# Patient Record
Sex: Male | Born: 1970
Health system: Southern US, Community
[De-identification: ages and names within clinical notes are randomized; demographics above are authoritative.]

## PROBLEM LIST (undated history)

## (undated) DIAGNOSIS — I10 Essential (primary) hypertension: Secondary | ICD-10-CM

## (undated) DIAGNOSIS — R569 Unspecified convulsions: Secondary | ICD-10-CM

## (undated) DIAGNOSIS — I519 Heart disease, unspecified: Secondary | ICD-10-CM

## (undated) DIAGNOSIS — I639 Cerebral infarction, unspecified: Secondary | ICD-10-CM

## (undated) DIAGNOSIS — D689 Coagulation defect, unspecified: Secondary | ICD-10-CM

## (undated) DIAGNOSIS — I219 Acute myocardial infarction, unspecified: Secondary | ICD-10-CM

## (undated) DIAGNOSIS — E785 Hyperlipidemia, unspecified: Secondary | ICD-10-CM

## (undated) DIAGNOSIS — I2510 Atherosclerotic heart disease of native coronary artery without angina pectoris: Secondary | ICD-10-CM

## (undated) DIAGNOSIS — G40909 Epilepsy, unspecified, not intractable, without status epilepticus: Secondary | ICD-10-CM

## (undated) DIAGNOSIS — K5190 Ulcerative colitis, unspecified, without complications: Secondary | ICD-10-CM

## (undated) DIAGNOSIS — I499 Cardiac arrhythmia, unspecified: Secondary | ICD-10-CM

## (undated) DIAGNOSIS — K589 Irritable bowel syndrome without diarrhea: Secondary | ICD-10-CM

## (undated) DIAGNOSIS — I469 Cardiac arrest, cause unspecified: Secondary | ICD-10-CM

## (undated) HISTORY — DX: Heart disease, unspecified: I51.9

## (undated) HISTORY — DX: Unspecified convulsions: R56.9

## (undated) HISTORY — DX: Coagulation defect, unspecified: D68.9

## (undated) HISTORY — DX: Hyperlipidemia, unspecified: E78.5

## (undated) HISTORY — PX: CARDIAC DEFIBRILLATOR PLACEMENT: SHX171

## (undated) HISTORY — DX: Atherosclerotic heart disease of native coronary artery without angina pectoris: I25.10

## (undated) HISTORY — DX: Cardiac arrhythmia, unspecified: I49.9

## (undated) HISTORY — DX: Cerebral infarction, unspecified: I63.9

## (undated) HISTORY — PX: CARDIAC CATHETERIZATION: SHX172

---

## 2015-04-20 DIAGNOSIS — Z5189 Encounter for other specified aftercare: Secondary | ICD-10-CM | POA: Diagnosis not present

## 2015-04-20 DIAGNOSIS — R269 Unspecified abnormalities of gait and mobility: Secondary | ICD-10-CM | POA: Diagnosis not present

## 2015-06-12 DIAGNOSIS — G4700 Insomnia, unspecified: Secondary | ICD-10-CM | POA: Diagnosis not present

## 2015-06-12 DIAGNOSIS — I639 Cerebral infarction, unspecified: Secondary | ICD-10-CM | POA: Diagnosis not present

## 2015-06-12 DIAGNOSIS — I69398 Other sequelae of cerebral infarction: Secondary | ICD-10-CM | POA: Diagnosis not present

## 2015-06-12 DIAGNOSIS — R569 Unspecified convulsions: Secondary | ICD-10-CM | POA: Diagnosis not present

## 2015-07-12 DIAGNOSIS — Z955 Presence of coronary angioplasty implant and graft: Secondary | ICD-10-CM | POA: Diagnosis not present

## 2015-07-12 DIAGNOSIS — I2510 Atherosclerotic heart disease of native coronary artery without angina pectoris: Secondary | ICD-10-CM | POA: Diagnosis not present

## 2015-07-12 DIAGNOSIS — E785 Hyperlipidemia, unspecified: Secondary | ICD-10-CM | POA: Diagnosis not present

## 2015-07-12 DIAGNOSIS — I10 Essential (primary) hypertension: Secondary | ICD-10-CM | POA: Diagnosis not present

## 2015-07-25 DIAGNOSIS — Z955 Presence of coronary angioplasty implant and graft: Secondary | ICD-10-CM | POA: Diagnosis not present

## 2015-07-25 DIAGNOSIS — I2510 Atherosclerotic heart disease of native coronary artery without angina pectoris: Secondary | ICD-10-CM | POA: Diagnosis not present

## 2015-07-25 DIAGNOSIS — I639 Cerebral infarction, unspecified: Secondary | ICD-10-CM | POA: Diagnosis not present

## 2015-09-21 DIAGNOSIS — I252 Old myocardial infarction: Secondary | ICD-10-CM | POA: Diagnosis not present

## 2015-09-21 DIAGNOSIS — I2510 Atherosclerotic heart disease of native coronary artery without angina pectoris: Secondary | ICD-10-CM | POA: Diagnosis not present

## 2015-09-21 DIAGNOSIS — I10 Essential (primary) hypertension: Secondary | ICD-10-CM | POA: Diagnosis not present

## 2015-09-21 DIAGNOSIS — R0602 Shortness of breath: Secondary | ICD-10-CM | POA: Diagnosis not present

## 2015-09-21 DIAGNOSIS — R569 Unspecified convulsions: Secondary | ICD-10-CM | POA: Diagnosis not present

## 2015-09-21 DIAGNOSIS — E876 Hypokalemia: Secondary | ICD-10-CM | POA: Diagnosis not present

## 2015-09-21 DIAGNOSIS — R9082 White matter disease, unspecified: Secondary | ICD-10-CM | POA: Diagnosis not present

## 2015-09-21 DIAGNOSIS — Z7902 Long term (current) use of antithrombotics/antiplatelets: Secondary | ICD-10-CM | POA: Diagnosis not present

## 2015-09-21 DIAGNOSIS — G9389 Other specified disorders of brain: Secondary | ICD-10-CM | POA: Diagnosis not present

## 2015-09-21 DIAGNOSIS — Z8673 Personal history of transient ischemic attack (TIA), and cerebral infarction without residual deficits: Secondary | ICD-10-CM | POA: Diagnosis not present

## 2015-09-21 DIAGNOSIS — I255 Ischemic cardiomyopathy: Secondary | ICD-10-CM | POA: Diagnosis not present

## 2015-09-21 DIAGNOSIS — I672 Cerebral atherosclerosis: Secondary | ICD-10-CM | POA: Diagnosis not present

## 2015-09-21 DIAGNOSIS — N390 Urinary tract infection, site not specified: Secondary | ICD-10-CM | POA: Diagnosis not present

## 2015-09-21 DIAGNOSIS — Z888 Allergy status to other drugs, medicaments and biological substances status: Secondary | ICD-10-CM | POA: Diagnosis not present

## 2015-09-21 DIAGNOSIS — J329 Chronic sinusitis, unspecified: Secondary | ICD-10-CM | POA: Diagnosis not present

## 2015-11-20 DIAGNOSIS — Z87891 Personal history of nicotine dependence: Secondary | ICD-10-CM | POA: Diagnosis not present

## 2015-11-20 DIAGNOSIS — I10 Essential (primary) hypertension: Secondary | ICD-10-CM | POA: Diagnosis not present

## 2015-11-20 DIAGNOSIS — D72829 Elevated white blood cell count, unspecified: Secondary | ICD-10-CM | POA: Diagnosis not present

## 2015-11-20 DIAGNOSIS — R569 Unspecified convulsions: Secondary | ICD-10-CM | POA: Diagnosis not present

## 2015-11-20 DIAGNOSIS — G40909 Epilepsy, unspecified, not intractable, without status epilepticus: Secondary | ICD-10-CM | POA: Diagnosis not present

## 2015-11-20 DIAGNOSIS — I160 Hypertensive urgency: Secondary | ICD-10-CM | POA: Diagnosis not present

## 2015-11-20 DIAGNOSIS — I2510 Atherosclerotic heart disease of native coronary artery without angina pectoris: Secondary | ICD-10-CM | POA: Diagnosis not present

## 2015-11-20 DIAGNOSIS — Z8673 Personal history of transient ischemic attack (TIA), and cerebral infarction without residual deficits: Secondary | ICD-10-CM | POA: Diagnosis not present

## 2015-11-20 DIAGNOSIS — I255 Ischemic cardiomyopathy: Secondary | ICD-10-CM | POA: Diagnosis not present

## 2015-11-20 DIAGNOSIS — Z955 Presence of coronary angioplasty implant and graft: Secondary | ICD-10-CM | POA: Diagnosis not present

## 2015-11-20 DIAGNOSIS — D6851 Activated protein C resistance: Secondary | ICD-10-CM | POA: Diagnosis not present

## 2015-11-20 DIAGNOSIS — I252 Old myocardial infarction: Secondary | ICD-10-CM | POA: Diagnosis not present

## 2015-11-20 DIAGNOSIS — G4700 Insomnia, unspecified: Secondary | ICD-10-CM | POA: Diagnosis not present

## 2015-11-20 DIAGNOSIS — R079 Chest pain, unspecified: Secondary | ICD-10-CM | POA: Diagnosis not present

## 2015-11-20 DIAGNOSIS — N179 Acute kidney failure, unspecified: Secondary | ICD-10-CM | POA: Diagnosis not present

## 2015-11-20 DIAGNOSIS — J9811 Atelectasis: Secondary | ICD-10-CM | POA: Diagnosis not present

## 2016-01-18 DIAGNOSIS — I639 Cerebral infarction, unspecified: Secondary | ICD-10-CM | POA: Diagnosis not present

## 2016-01-18 DIAGNOSIS — D6851 Activated protein C resistance: Secondary | ICD-10-CM | POA: Diagnosis not present

## 2016-02-16 DIAGNOSIS — Z743 Need for continuous supervision: Secondary | ICD-10-CM | POA: Diagnosis not present

## 2016-02-16 DIAGNOSIS — R4182 Altered mental status, unspecified: Secondary | ICD-10-CM | POA: Diagnosis not present

## 2016-02-17 DIAGNOSIS — R51 Headache: Secondary | ICD-10-CM | POA: Diagnosis not present

## 2016-02-17 DIAGNOSIS — S0990XA Unspecified injury of head, initial encounter: Secondary | ICD-10-CM | POA: Diagnosis not present

## 2016-04-04 DIAGNOSIS — R9082 White matter disease, unspecified: Secondary | ICD-10-CM | POA: Diagnosis not present

## 2016-04-04 DIAGNOSIS — Z950 Presence of cardiac pacemaker: Secondary | ICD-10-CM | POA: Diagnosis not present

## 2016-04-04 DIAGNOSIS — R4182 Altered mental status, unspecified: Secondary | ICD-10-CM | POA: Diagnosis not present

## 2016-04-04 DIAGNOSIS — D72829 Elevated white blood cell count, unspecified: Secondary | ICD-10-CM | POA: Diagnosis not present

## 2016-04-04 DIAGNOSIS — J9811 Atelectasis: Secondary | ICD-10-CM | POA: Diagnosis not present

## 2016-04-04 DIAGNOSIS — D6851 Activated protein C resistance: Secondary | ICD-10-CM | POA: Diagnosis not present

## 2016-04-04 DIAGNOSIS — Z743 Need for continuous supervision: Secondary | ICD-10-CM | POA: Diagnosis not present

## 2016-04-04 DIAGNOSIS — E785 Hyperlipidemia, unspecified: Secondary | ICD-10-CM | POA: Diagnosis not present

## 2016-04-04 DIAGNOSIS — J181 Lobar pneumonia, unspecified organism: Secondary | ICD-10-CM | POA: Diagnosis not present

## 2016-04-04 DIAGNOSIS — E876 Hypokalemia: Secondary | ICD-10-CM | POA: Diagnosis not present

## 2016-04-04 DIAGNOSIS — I469 Cardiac arrest, cause unspecified: Secondary | ICD-10-CM | POA: Diagnosis not present

## 2016-04-04 DIAGNOSIS — I2510 Atherosclerotic heart disease of native coronary artery without angina pectoris: Secondary | ICD-10-CM | POA: Diagnosis not present

## 2016-04-04 DIAGNOSIS — J90 Pleural effusion, not elsewhere classified: Secondary | ICD-10-CM | POA: Diagnosis not present

## 2016-04-04 DIAGNOSIS — K3189 Other diseases of stomach and duodenum: Secondary | ICD-10-CM | POA: Diagnosis not present

## 2016-04-04 DIAGNOSIS — R569 Unspecified convulsions: Secondary | ICD-10-CM | POA: Diagnosis not present

## 2016-04-04 DIAGNOSIS — J156 Pneumonia due to other aerobic Gram-negative bacteria: Secondary | ICD-10-CM | POA: Diagnosis not present

## 2016-04-04 DIAGNOSIS — R1110 Vomiting, unspecified: Secondary | ICD-10-CM | POA: Diagnosis not present

## 2016-04-04 DIAGNOSIS — G9389 Other specified disorders of brain: Secondary | ICD-10-CM | POA: Diagnosis not present

## 2016-04-04 DIAGNOSIS — A419 Sepsis, unspecified organism: Secondary | ICD-10-CM | POA: Diagnosis not present

## 2016-04-04 DIAGNOSIS — J9600 Acute respiratory failure, unspecified whether with hypoxia or hypercapnia: Secondary | ICD-10-CM | POA: Diagnosis not present

## 2016-04-04 DIAGNOSIS — R140 Abdominal distension (gaseous): Secondary | ICD-10-CM | POA: Diagnosis not present

## 2016-04-04 DIAGNOSIS — G9341 Metabolic encephalopathy: Secondary | ICD-10-CM | POA: Diagnosis not present

## 2016-04-04 DIAGNOSIS — Z7901 Long term (current) use of anticoagulants: Secondary | ICD-10-CM | POA: Diagnosis not present

## 2016-04-04 DIAGNOSIS — I252 Old myocardial infarction: Secondary | ICD-10-CM | POA: Diagnosis not present

## 2016-04-04 DIAGNOSIS — Z4682 Encounter for fitting and adjustment of non-vascular catheter: Secondary | ICD-10-CM | POA: Diagnosis not present

## 2016-04-04 DIAGNOSIS — Z8673 Personal history of transient ischemic attack (TIA), and cerebral infarction without residual deficits: Secondary | ICD-10-CM | POA: Diagnosis not present

## 2016-04-04 DIAGNOSIS — Z431 Encounter for attention to gastrostomy: Secondary | ICD-10-CM | POA: Diagnosis not present

## 2016-04-04 DIAGNOSIS — K5010 Crohn's disease of large intestine without complications: Secondary | ICD-10-CM | POA: Diagnosis not present

## 2016-04-04 DIAGNOSIS — I472 Ventricular tachycardia: Secondary | ICD-10-CM | POA: Diagnosis not present

## 2016-04-04 DIAGNOSIS — E872 Acidosis: Secondary | ICD-10-CM | POA: Diagnosis not present

## 2016-04-04 DIAGNOSIS — J189 Pneumonia, unspecified organism: Secondary | ICD-10-CM | POA: Diagnosis not present

## 2016-04-04 DIAGNOSIS — R6510 Systemic inflammatory response syndrome (SIRS) of non-infectious origin without acute organ dysfunction: Secondary | ICD-10-CM | POA: Diagnosis not present

## 2016-04-04 DIAGNOSIS — Z452 Encounter for adjustment and management of vascular access device: Secondary | ICD-10-CM | POA: Diagnosis not present

## 2016-04-04 DIAGNOSIS — R2241 Localized swelling, mass and lump, right lower limb: Secondary | ICD-10-CM | POA: Diagnosis not present

## 2016-04-04 DIAGNOSIS — J9601 Acute respiratory failure with hypoxia: Secondary | ICD-10-CM | POA: Diagnosis not present

## 2016-04-04 DIAGNOSIS — R918 Other nonspecific abnormal finding of lung field: Secondary | ICD-10-CM | POA: Diagnosis not present

## 2016-04-04 DIAGNOSIS — I4901 Ventricular fibrillation: Secondary | ICD-10-CM | POA: Diagnosis not present

## 2016-04-04 DIAGNOSIS — I5021 Acute systolic (congestive) heart failure: Secondary | ICD-10-CM | POA: Diagnosis not present

## 2016-04-04 DIAGNOSIS — I119 Hypertensive heart disease without heart failure: Secondary | ICD-10-CM | POA: Diagnosis not present

## 2016-04-04 DIAGNOSIS — R1312 Dysphagia, oropharyngeal phase: Secondary | ICD-10-CM | POA: Diagnosis not present

## 2016-04-04 DIAGNOSIS — J0100 Acute maxillary sinusitis, unspecified: Secondary | ICD-10-CM | POA: Diagnosis not present

## 2016-04-04 DIAGNOSIS — Z978 Presence of other specified devices: Secondary | ICD-10-CM | POA: Diagnosis not present

## 2016-04-04 DIAGNOSIS — J690 Pneumonitis due to inhalation of food and vomit: Secondary | ICD-10-CM | POA: Diagnosis not present

## 2016-04-05 DIAGNOSIS — I469 Cardiac arrest, cause unspecified: Secondary | ICD-10-CM | POA: Diagnosis not present

## 2016-04-05 DIAGNOSIS — R4182 Altered mental status, unspecified: Secondary | ICD-10-CM | POA: Diagnosis not present

## 2016-04-05 DIAGNOSIS — J9600 Acute respiratory failure, unspecified whether with hypoxia or hypercapnia: Secondary | ICD-10-CM | POA: Diagnosis not present

## 2016-04-05 DIAGNOSIS — Z8673 Personal history of transient ischemic attack (TIA), and cerebral infarction without residual deficits: Secondary | ICD-10-CM | POA: Diagnosis not present

## 2016-04-05 DIAGNOSIS — D6851 Activated protein C resistance: Secondary | ICD-10-CM | POA: Diagnosis not present

## 2016-04-05 DIAGNOSIS — R569 Unspecified convulsions: Secondary | ICD-10-CM | POA: Diagnosis not present

## 2016-04-06 DIAGNOSIS — R569 Unspecified convulsions: Secondary | ICD-10-CM | POA: Diagnosis not present

## 2016-04-06 DIAGNOSIS — I469 Cardiac arrest, cause unspecified: Secondary | ICD-10-CM | POA: Diagnosis not present

## 2016-04-06 DIAGNOSIS — D6851 Activated protein C resistance: Secondary | ICD-10-CM | POA: Diagnosis not present

## 2016-04-06 DIAGNOSIS — R4182 Altered mental status, unspecified: Secondary | ICD-10-CM | POA: Diagnosis not present

## 2016-04-06 DIAGNOSIS — Z8673 Personal history of transient ischemic attack (TIA), and cerebral infarction without residual deficits: Secondary | ICD-10-CM | POA: Diagnosis not present

## 2016-04-06 DIAGNOSIS — R918 Other nonspecific abnormal finding of lung field: Secondary | ICD-10-CM | POA: Diagnosis not present

## 2016-04-06 DIAGNOSIS — Z978 Presence of other specified devices: Secondary | ICD-10-CM | POA: Diagnosis not present

## 2016-04-06 DIAGNOSIS — J9600 Acute respiratory failure, unspecified whether with hypoxia or hypercapnia: Secondary | ICD-10-CM | POA: Diagnosis not present

## 2016-04-07 DIAGNOSIS — R918 Other nonspecific abnormal finding of lung field: Secondary | ICD-10-CM | POA: Diagnosis not present

## 2016-04-07 DIAGNOSIS — D6851 Activated protein C resistance: Secondary | ICD-10-CM | POA: Diagnosis not present

## 2016-04-07 DIAGNOSIS — I5021 Acute systolic (congestive) heart failure: Secondary | ICD-10-CM | POA: Diagnosis not present

## 2016-04-07 DIAGNOSIS — R2241 Localized swelling, mass and lump, right lower limb: Secondary | ICD-10-CM | POA: Diagnosis not present

## 2016-04-07 DIAGNOSIS — I469 Cardiac arrest, cause unspecified: Secondary | ICD-10-CM | POA: Diagnosis not present

## 2016-04-07 DIAGNOSIS — J9601 Acute respiratory failure with hypoxia: Secondary | ICD-10-CM | POA: Diagnosis not present

## 2016-04-07 DIAGNOSIS — J690 Pneumonitis due to inhalation of food and vomit: Secondary | ICD-10-CM | POA: Diagnosis not present

## 2016-04-08 DIAGNOSIS — J90 Pleural effusion, not elsewhere classified: Secondary | ICD-10-CM | POA: Diagnosis not present

## 2016-04-08 DIAGNOSIS — J9811 Atelectasis: Secondary | ICD-10-CM | POA: Diagnosis not present

## 2016-04-08 DIAGNOSIS — J189 Pneumonia, unspecified organism: Secondary | ICD-10-CM | POA: Diagnosis not present

## 2016-04-08 DIAGNOSIS — J9601 Acute respiratory failure with hypoxia: Secondary | ICD-10-CM | POA: Diagnosis not present

## 2016-04-08 DIAGNOSIS — E876 Hypokalemia: Secondary | ICD-10-CM | POA: Diagnosis not present

## 2016-04-08 DIAGNOSIS — R918 Other nonspecific abnormal finding of lung field: Secondary | ICD-10-CM | POA: Diagnosis not present

## 2016-04-08 DIAGNOSIS — A419 Sepsis, unspecified organism: Secondary | ICD-10-CM | POA: Diagnosis not present

## 2016-04-09 DIAGNOSIS — J9811 Atelectasis: Secondary | ICD-10-CM | POA: Diagnosis not present

## 2016-04-09 DIAGNOSIS — J9601 Acute respiratory failure with hypoxia: Secondary | ICD-10-CM | POA: Diagnosis not present

## 2016-04-09 DIAGNOSIS — I4901 Ventricular fibrillation: Secondary | ICD-10-CM | POA: Diagnosis not present

## 2016-04-09 DIAGNOSIS — Z431 Encounter for attention to gastrostomy: Secondary | ICD-10-CM | POA: Diagnosis not present

## 2016-04-09 DIAGNOSIS — A419 Sepsis, unspecified organism: Secondary | ICD-10-CM | POA: Diagnosis not present

## 2016-04-09 DIAGNOSIS — J189 Pneumonia, unspecified organism: Secondary | ICD-10-CM | POA: Diagnosis not present

## 2016-04-09 DIAGNOSIS — J90 Pleural effusion, not elsewhere classified: Secondary | ICD-10-CM | POA: Diagnosis not present

## 2016-04-10 DIAGNOSIS — K3189 Other diseases of stomach and duodenum: Secondary | ICD-10-CM | POA: Diagnosis not present

## 2016-04-10 DIAGNOSIS — J189 Pneumonia, unspecified organism: Secondary | ICD-10-CM | POA: Diagnosis not present

## 2016-04-10 DIAGNOSIS — R918 Other nonspecific abnormal finding of lung field: Secondary | ICD-10-CM | POA: Diagnosis not present

## 2016-04-10 DIAGNOSIS — G9341 Metabolic encephalopathy: Secondary | ICD-10-CM | POA: Diagnosis not present

## 2016-04-10 DIAGNOSIS — J90 Pleural effusion, not elsewhere classified: Secondary | ICD-10-CM | POA: Diagnosis not present

## 2016-04-11 DIAGNOSIS — J189 Pneumonia, unspecified organism: Secondary | ICD-10-CM | POA: Diagnosis not present

## 2016-04-11 DIAGNOSIS — J9601 Acute respiratory failure with hypoxia: Secondary | ICD-10-CM | POA: Diagnosis not present

## 2016-04-11 DIAGNOSIS — R918 Other nonspecific abnormal finding of lung field: Secondary | ICD-10-CM | POA: Diagnosis not present

## 2016-04-12 DIAGNOSIS — R1312 Dysphagia, oropharyngeal phase: Secondary | ICD-10-CM | POA: Diagnosis not present

## 2016-04-12 DIAGNOSIS — I472 Ventricular tachycardia: Secondary | ICD-10-CM | POA: Diagnosis not present

## 2016-04-12 DIAGNOSIS — J181 Lobar pneumonia, unspecified organism: Secondary | ICD-10-CM | POA: Diagnosis not present

## 2016-04-12 DIAGNOSIS — I469 Cardiac arrest, cause unspecified: Secondary | ICD-10-CM | POA: Diagnosis not present

## 2016-04-13 DIAGNOSIS — R1110 Vomiting, unspecified: Secondary | ICD-10-CM | POA: Diagnosis not present

## 2016-04-13 DIAGNOSIS — Z978 Presence of other specified devices: Secondary | ICD-10-CM | POA: Diagnosis not present

## 2016-04-13 DIAGNOSIS — R6510 Systemic inflammatory response syndrome (SIRS) of non-infectious origin without acute organ dysfunction: Secondary | ICD-10-CM | POA: Diagnosis not present

## 2016-04-13 DIAGNOSIS — R918 Other nonspecific abnormal finding of lung field: Secondary | ICD-10-CM | POA: Diagnosis not present

## 2016-04-13 DIAGNOSIS — R140 Abdominal distension (gaseous): Secondary | ICD-10-CM | POA: Diagnosis not present

## 2016-04-13 DIAGNOSIS — J189 Pneumonia, unspecified organism: Secondary | ICD-10-CM | POA: Diagnosis not present

## 2016-04-14 DIAGNOSIS — J90 Pleural effusion, not elsewhere classified: Secondary | ICD-10-CM | POA: Diagnosis not present

## 2016-04-14 DIAGNOSIS — D72829 Elevated white blood cell count, unspecified: Secondary | ICD-10-CM | POA: Diagnosis not present

## 2016-04-14 DIAGNOSIS — J189 Pneumonia, unspecified organism: Secondary | ICD-10-CM | POA: Diagnosis not present

## 2016-04-14 DIAGNOSIS — J9601 Acute respiratory failure with hypoxia: Secondary | ICD-10-CM | POA: Diagnosis not present

## 2016-04-14 DIAGNOSIS — R918 Other nonspecific abnormal finding of lung field: Secondary | ICD-10-CM | POA: Diagnosis not present

## 2016-04-15 DIAGNOSIS — R918 Other nonspecific abnormal finding of lung field: Secondary | ICD-10-CM | POA: Diagnosis not present

## 2016-04-15 DIAGNOSIS — D6851 Activated protein C resistance: Secondary | ICD-10-CM | POA: Diagnosis not present

## 2016-04-15 DIAGNOSIS — I469 Cardiac arrest, cause unspecified: Secondary | ICD-10-CM | POA: Diagnosis not present

## 2016-04-15 DIAGNOSIS — J9601 Acute respiratory failure with hypoxia: Secondary | ICD-10-CM | POA: Diagnosis not present

## 2016-04-15 DIAGNOSIS — J690 Pneumonitis due to inhalation of food and vomit: Secondary | ICD-10-CM | POA: Diagnosis not present

## 2016-04-16 DIAGNOSIS — I4901 Ventricular fibrillation: Secondary | ICD-10-CM | POA: Diagnosis not present

## 2016-04-16 DIAGNOSIS — I469 Cardiac arrest, cause unspecified: Secondary | ICD-10-CM | POA: Diagnosis not present

## 2016-04-16 DIAGNOSIS — R918 Other nonspecific abnormal finding of lung field: Secondary | ICD-10-CM | POA: Diagnosis not present

## 2016-04-16 DIAGNOSIS — Z950 Presence of cardiac pacemaker: Secondary | ICD-10-CM | POA: Diagnosis not present

## 2016-04-16 DIAGNOSIS — J9601 Acute respiratory failure with hypoxia: Secondary | ICD-10-CM | POA: Diagnosis not present

## 2016-04-16 DIAGNOSIS — I472 Ventricular tachycardia: Secondary | ICD-10-CM | POA: Diagnosis not present

## 2016-04-16 DIAGNOSIS — J690 Pneumonitis due to inhalation of food and vomit: Secondary | ICD-10-CM | POA: Diagnosis not present

## 2016-04-16 DIAGNOSIS — J90 Pleural effusion, not elsewhere classified: Secondary | ICD-10-CM | POA: Diagnosis not present

## 2016-04-16 DIAGNOSIS — D6851 Activated protein C resistance: Secondary | ICD-10-CM | POA: Diagnosis not present

## 2016-04-16 HISTORY — PX: ICD IMPLANT: EP1208

## 2016-04-17 DIAGNOSIS — G9341 Metabolic encephalopathy: Secondary | ICD-10-CM | POA: Diagnosis not present

## 2016-04-17 DIAGNOSIS — A419 Sepsis, unspecified organism: Secondary | ICD-10-CM | POA: Diagnosis not present

## 2016-04-17 DIAGNOSIS — J9601 Acute respiratory failure with hypoxia: Secondary | ICD-10-CM | POA: Diagnosis not present

## 2016-04-17 DIAGNOSIS — I469 Cardiac arrest, cause unspecified: Secondary | ICD-10-CM | POA: Diagnosis not present

## 2016-04-25 DIAGNOSIS — Z09 Encounter for follow-up examination after completed treatment for conditions other than malignant neoplasm: Secondary | ICD-10-CM | POA: Diagnosis not present

## 2016-04-25 DIAGNOSIS — I252 Old myocardial infarction: Secondary | ICD-10-CM | POA: Diagnosis not present

## 2016-05-01 DIAGNOSIS — I429 Cardiomyopathy, unspecified: Secondary | ICD-10-CM | POA: Diagnosis not present

## 2016-05-01 DIAGNOSIS — Z95810 Presence of automatic (implantable) cardiac defibrillator: Secondary | ICD-10-CM | POA: Diagnosis not present

## 2016-05-01 DIAGNOSIS — R4182 Altered mental status, unspecified: Secondary | ICD-10-CM | POA: Diagnosis not present

## 2016-05-22 DIAGNOSIS — Z95810 Presence of automatic (implantable) cardiac defibrillator: Secondary | ICD-10-CM | POA: Diagnosis not present

## 2016-05-22 DIAGNOSIS — I429 Cardiomyopathy, unspecified: Secondary | ICD-10-CM | POA: Diagnosis not present

## 2017-01-20 DIAGNOSIS — Z95810 Presence of automatic (implantable) cardiac defibrillator: Secondary | ICD-10-CM | POA: Diagnosis not present

## 2017-01-20 DIAGNOSIS — I429 Cardiomyopathy, unspecified: Secondary | ICD-10-CM | POA: Diagnosis not present

## 2017-01-29 DIAGNOSIS — Z95810 Presence of automatic (implantable) cardiac defibrillator: Secondary | ICD-10-CM | POA: Diagnosis not present

## 2017-01-29 DIAGNOSIS — I429 Cardiomyopathy, unspecified: Secondary | ICD-10-CM | POA: Diagnosis not present

## 2017-02-06 DIAGNOSIS — R4781 Slurred speech: Secondary | ICD-10-CM | POA: Diagnosis not present

## 2017-02-06 DIAGNOSIS — G9389 Other specified disorders of brain: Secondary | ICD-10-CM | POA: Diagnosis not present

## 2017-02-06 DIAGNOSIS — R9082 White matter disease, unspecified: Secondary | ICD-10-CM | POA: Diagnosis not present

## 2017-02-06 DIAGNOSIS — Z8673 Personal history of transient ischemic attack (TIA), and cerebral infarction without residual deficits: Secondary | ICD-10-CM | POA: Diagnosis not present

## 2017-02-06 DIAGNOSIS — I10 Essential (primary) hypertension: Secondary | ICD-10-CM | POA: Diagnosis not present

## 2017-02-06 DIAGNOSIS — I252 Old myocardial infarction: Secondary | ICD-10-CM | POA: Diagnosis not present

## 2017-02-06 DIAGNOSIS — Z955 Presence of coronary angioplasty implant and graft: Secondary | ICD-10-CM | POA: Diagnosis not present

## 2017-02-06 DIAGNOSIS — J324 Chronic pansinusitis: Secondary | ICD-10-CM | POA: Diagnosis not present

## 2017-02-06 DIAGNOSIS — R569 Unspecified convulsions: Secondary | ICD-10-CM | POA: Diagnosis not present

## 2017-02-06 DIAGNOSIS — Z7901 Long term (current) use of anticoagulants: Secondary | ICD-10-CM | POA: Diagnosis not present

## 2017-02-06 DIAGNOSIS — K589 Irritable bowel syndrome without diarrhea: Secondary | ICD-10-CM | POA: Diagnosis not present

## 2017-02-06 DIAGNOSIS — R200 Anesthesia of skin: Secondary | ICD-10-CM | POA: Diagnosis not present

## 2017-02-06 DIAGNOSIS — I4901 Ventricular fibrillation: Secondary | ICD-10-CM | POA: Diagnosis not present

## 2017-02-06 DIAGNOSIS — Z8249 Family history of ischemic heart disease and other diseases of the circulatory system: Secondary | ICD-10-CM | POA: Diagnosis not present

## 2017-02-06 DIAGNOSIS — G40909 Epilepsy, unspecified, not intractable, without status epilepticus: Secondary | ICD-10-CM | POA: Diagnosis not present

## 2017-02-06 DIAGNOSIS — I255 Ischemic cardiomyopathy: Secondary | ICD-10-CM | POA: Diagnosis not present

## 2017-02-06 DIAGNOSIS — Z95810 Presence of automatic (implantable) cardiac defibrillator: Secondary | ICD-10-CM | POA: Diagnosis not present

## 2017-02-06 DIAGNOSIS — I2510 Atherosclerotic heart disease of native coronary artery without angina pectoris: Secondary | ICD-10-CM | POA: Diagnosis not present

## 2017-02-06 DIAGNOSIS — I672 Cerebral atherosclerosis: Secondary | ICD-10-CM | POA: Diagnosis not present

## 2017-02-06 DIAGNOSIS — I6930 Unspecified sequelae of cerebral infarction: Secondary | ICD-10-CM | POA: Diagnosis not present

## 2017-02-06 DIAGNOSIS — Z87891 Personal history of nicotine dependence: Secondary | ICD-10-CM | POA: Diagnosis not present

## 2017-02-06 DIAGNOSIS — D6851 Activated protein C resistance: Secondary | ICD-10-CM | POA: Diagnosis not present

## 2017-02-20 DIAGNOSIS — G40209 Localization-related (focal) (partial) symptomatic epilepsy and epileptic syndromes with complex partial seizures, not intractable, without status epilepticus: Secondary | ICD-10-CM | POA: Diagnosis not present

## 2017-02-20 DIAGNOSIS — I252 Old myocardial infarction: Secondary | ICD-10-CM | POA: Diagnosis not present

## 2017-02-20 DIAGNOSIS — D6851 Activated protein C resistance: Secondary | ICD-10-CM | POA: Diagnosis not present

## 2017-03-03 ENCOUNTER — Encounter: Payer: Self-pay | Admitting: Cardiovascular Disease

## 2017-03-03 ENCOUNTER — Ambulatory Visit: Payer: BLUE CROSS/BLUE SHIELD | Admitting: Cardiovascular Disease

## 2017-03-03 VITALS — BP 132/96 | HR 61 | Ht 74.0 in | Wt 191.6 lb

## 2017-03-03 DIAGNOSIS — I69354 Hemiplegia and hemiparesis following cerebral infarction affecting left non-dominant side: Secondary | ICD-10-CM | POA: Diagnosis not present

## 2017-03-03 DIAGNOSIS — I4901 Ventricular fibrillation: Secondary | ICD-10-CM

## 2017-03-03 DIAGNOSIS — I2510 Atherosclerotic heart disease of native coronary artery without angina pectoris: Secondary | ICD-10-CM

## 2017-03-03 DIAGNOSIS — Z95810 Presence of automatic (implantable) cardiac defibrillator: Secondary | ICD-10-CM

## 2017-03-03 DIAGNOSIS — I519 Heart disease, unspecified: Secondary | ICD-10-CM | POA: Insufficient documentation

## 2017-03-03 DIAGNOSIS — Z7901 Long term (current) use of anticoagulants: Secondary | ICD-10-CM | POA: Diagnosis not present

## 2017-03-03 DIAGNOSIS — Z79899 Other long term (current) drug therapy: Secondary | ICD-10-CM

## 2017-03-03 DIAGNOSIS — I5022 Chronic systolic (congestive) heart failure: Secondary | ICD-10-CM | POA: Diagnosis not present

## 2017-03-03 DIAGNOSIS — E785 Hyperlipidemia, unspecified: Secondary | ICD-10-CM | POA: Diagnosis not present

## 2017-03-03 DIAGNOSIS — D6851 Activated protein C resistance: Secondary | ICD-10-CM | POA: Diagnosis not present

## 2017-03-03 MED ORDER — LOSARTAN POTASSIUM 25 MG PO TABS: 25.0000 mg | ORAL_TABLET | Freq: Every day | ORAL | 3 refills | Status: DC

## 2017-03-03 MED ORDER — HYDROCHLOROTHIAZIDE 12.5 MG PO TABS: 12.5000 mg | ORAL_TABLET | Freq: Every day | ORAL | 3 refills | Status: DC

## 2017-03-03 NOTE — Progress Notes (Signed)
Cardiology Office Note:    Date:  03/04/2017   ID:  Roy Lozano, DOB Oct 21, 1970, MRN [id number]  PCP:  Patient, No Pcp Per  Cardiologist:  New  Referring MD: No ref. provider found   No chief complaint on file. Establish Cardiology and ICD follow up  History of Present Illness:    Roy Lozano is a 47 y.o. male with a hx of previous myocardial infarction, history of cardiac arrest in April 2018, followed by implantation of a single-chamber defibrillator with 2 subsequent defibrillator discharges for ventricular fibrillation.  He has recently moved here from [place] and is here to establish cardiology follow-up.  The only records from [place] that are available for review are his last defibrillator check, performed after the device discharge on January 29.  We do not have any records about his coronary status, left ventricular systolic function, other cardiac issues.  The one page device report from earlier this year has some limited information.  We performed a complete interrogation of his device today.  The cardiologist who implanted and supervised his device is Dr. Priscella Mann.  His device is a Medtronic Visia AF MRI VR device.  It was implanted on April 16, 2016, has a generator voltage of 3.05 V, estimated longevity of 11 years.  Single ventricular lead has normal parameters: Impedance 475 ohms, high-voltage impedance 78 ohms, threshold 0.75 V at 0.4 ms, R waves greater than 20 mV.  The device is programmed as a backup VVI 40 bpm shock box.  He has never requires ventricular pacing.  Heart rate histogram distribution is normal and activity level is roughly 1.3 hours a day.  Interrogation of the device shows that he has delivered 2 shocks.  1 of them was last November and the details have been deleted from the device memory.  The other one occurred on January 29 at 0351 hrs.  He had abrupt onset of what appears to be polymorphic VT/VF with a cycle length varying between 130 and  190 ms and appropriate single defibrillator discharge at 36 J to convert the rhythm to ventricular pacing.  Since that event to have been no episodes of atrial fibrillation or ventricular tachycardia.  Roy Lozano says that evaluation following the shock showed normal electrolytes and no new changes with his heart but cannot provide additional details.  He was unaware of the discharge which occurred in the middle of the night.  He was warned of its occurrence due to the device alarms.  Unfortunately, Roy Lozano also has some mild-moderate memory issues since his cardiac arrest and has not always available to provide details about his medical problems.  He is clearly a very intelligent and educated gentleman, he is here alone today.  He reports that his cardiac arrest occurred during hospitalization for pneumonia in April 2018.  It is not clear about the sequence of events but he had a large heart attack of the front wall of his heart.  He received stents.  He has also had a stroke which has left him with mild left-sided hemiparesis.  He states that his left hand is always cold.  He has limited dexterity and ability to grasp without limb and is applying for disability based on this.  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  In addition to his cardiac problem has a history of epilepsy and factor V Leiden deficiency.  He reports having had a previous stroke.  His seizure disorder took a long time to diagnose accurately and treat, but is  currently well controlled with antiepileptic medications.  He has a history of hypertension and hyperlipidemia.  He smoked very briefly and in very small amounts: roughly a pack every 2 weeks for 1-1/2 years, quit 2018.  His list of medications includes clopidogrel and Pradaxa as well as a beta-blocker and low dose angiotensin receptor blocker.  He has been prescribed high-dose atorvastatin.  He does not have dyspnea at rest or with activity, denies orthopnea, PND, leg edema, recent  seizures or focal neurological events.  He has not had any bleeding problems.  He has not had problems with palpitations or syncope.  Past Medical History:  Diagnosis Date  . Arrhythmia   . Clotting disorder ([place])    factor 5 Leiden   . Coronary artery disease   . Heart disease   . Hyperlipidemia   . Seizures ([place])   . Stroke [facility])     Past Surgical History:  Procedure Laterality Date  . ICD IMPLANT Left 04/16/2016   Medtronic    Current Medications: Current Meds  Medication Sig  . atorvastatin (LIPITOR) 80 MG tablet Take 1 tablet by mouth every evening.  . clopidogrel (PLAVIX) 75 MG tablet Take 1 tablet by mouth daily.  . dabigatran (PRADAXA) 150 MG CAPS capsule Take 1 capsule by mouth 2 (two) times daily.  . hydrochlorothiazide (HYDRODIURIL) 12.5 MG tablet Take 1 tablet (12.5 mg total) by mouth daily.  . Levetiracetam 750 MG TB24 Take 1 tablet by mouth 2 (two) times daily.  . metoprolol succinate (TOPROL-XL) 50 MG 24 hr tablet Take 1 tablet by mouth daily.  . nitroGLYCERIN (NITROSTAT) 0.4 MG SL tablet Place 1 tablet under the tongue as needed. X 3 doses  . traZODone (DESYREL) 50 MG tablet Take 1 tablet by mouth at bedtime.  . [DISCONTINUED] hydrochlorothiazide (HYDRODIURIL) 12.5 MG tablet Take 1 tablet by mouth daily.     Allergies:   Patient has no known allergies.   Social History   Socioeconomic History  . Marital status: Single    Spouse name: None  . Number of children: None  . Years of education: None  . Highest education level: None  Social Needs  . Financial resource strain: None  . Food insecurity - worry: None  . Food insecurity - inability: None  . Transportation needs - medical: None  . Transportation needs - non-medical: None  Occupational History  . None  Tobacco Use  . Smoking status: Former Smoker    Types: Cigarettes    Start date: 03/03/2016  . Smokeless tobacco: Never Used  Substance and Sexual Activity  . Alcohol use: Yes    Comment:  rarely  . Drug use: No  . Sexual activity: None  Other Topics Concern  . None  Social History Narrative  . None     Family History: The patient's family history includes Heart attack in his father; Heart disease in his paternal grandfather and paternal grandmother; Stroke in his maternal grandfather and maternal grandmother.  ROS:   Please see the history of present illness.  All other systems reviewed and are negative.  EKGs/Labs/Other Studies Reviewed:    The following studies were reviewed today: ICD check  EKG:  EKG is  ordered today.  The ekg ordered today demonstrates sinus rhythm with Q waves and subtle residual ST segment elevation in leads V1-V2, flat slightly negative T waves V3-V6, 1, aVL.  QTC 410 ms  Recent Labs: No results found for requested labs within last 8760 hours.  Recent Lipid  Panel No results found for: CHOL, TRIG, HDL, CHOLHDL, VLDL, LDLCALC, LDLDIRECT  Physical Exam:    VS:  BP (!) 132/96 (BP Location: Right Arm)   Pulse 61   Ht 6' 2"  (1.88 m)   Wt 191 lb 9.6 oz (86.9 kg)   BMI 24.60 kg/m     Wt Readings from Last 3 Encounters:  03/03/17 191 lb 9.6 oz (86.9 kg)     GEN: Lean, Well nourished, well developed in no acute distress HEENT: Normal NECK: No JVD; No carotid bruits LYMPHATICS: No lymphadenopathy CARDIAC: Nondisplaced apical impulse, RRR, no murmurs, rubs, gallops, healthy left subclavian defibrillator site RESPIRATORY:  Clear to auscultation without rales, wheezing or rhonchi  ABDOMEN: Soft, non-tender, non-distended MUSCULOSKELETAL:  No edema; No deformity  SKIN: No difference in blood pressure between the 2 limbs.  The left hand is slightly cyanotic and cool compared to the right side which is pink and warm.  Nevertheless he has normal radial and ulnar pulses NEUROLOGIC:  Alert and oriented x 3, mild 4/5 weakness in the left upper extremity, mild loss of dexterity in the left hand slight ataxia.  Otherwise nonfocal neurological  exam PSYCHIATRIC:  Normal affect   ASSESSMENT:    1. VF (ventricular fibrillation) ([place])   2. Coronary artery disease involving native coronary artery of native heart without angina pectoris   3. CHF (congestive heart failure), NYHA class I, chronic, systolic ([place])   4. Factor V Leiden ([place])   5. Long term (current) use of anticoagulants   6. Hemiparesis affecting left side as late effect of stroke ([place])   7. Dyslipidemia   8. ICD (implantable cardioverter-defibrillator) in place   9. Medication management    PLAN:    In order of problems listed above:  1. VF: ECG is consistent with a previous anteroseptal myocardial infarction, possibly with some degree of aneurysm formation as supported by the presence of residual ST segment elevation.  It is possible that this is the nidus for repeated episodes of ventricular arrhythmia.  2. CAD: He is currently free of angina.  He is on antiplatelet and statin therapy as well as beta-blockers.  Need to retrieve previous catheter report and history.  It sounds like he still has a relatively fresh stent, probably placed in April 2018.  If this was a drug-eluting device he should continue antiplatelet therapy at least through the end of April. 3. CHF: Suspect he has depressed left ventricular systolic function, but that data  is not available.  We will get his records from [place] but he also should have an updated echocardiogram.  He is on beta-blockers and a relatively low dose of losartan.  His blood pressure would permit escalation of the dose of ARB or even switching to Entresto if LVEF is under 40%.  We will get records and repeat echo first.  Clinically he appears to be euvolemic, NYHA functional class I, without need for loop diuretics. 4. Factor V Leiden: Not sure if it was felt that his heart attack or stroke was related to this disorder, usually associated with venous clotting.  He is on a direct oral anticoagulant, Pradaxa. 5. Pradaxa: seems  to be well-tolerated without bleeding problems. 6. L hemiparesis: Residual of previous stroke, interfering with his ability to work. 7. HLP: On high-dose statin.  Need to get updated lipid profile. 8. ICD: Normal device function.  It seems he has had 2 previous appropriate shocks for ventricular fibrillation.  He is not on antiarrhythmic therapy,  but is receiving beta-blockers.  I am a little surprised that he was not placed on any antiarrhythmic therapy following the second device discharge.  We will obtain records from [place].  Low threshold for referral to EP once we have the available data. 9. Seizures: reportedly well controlled.   Medication Adjustments/Labs and Tests Ordered: Current medicines are reviewed at length with the patient today.  Concerns regarding medicines are outlined above.  Orders Placed This Encounter  Procedures  . Lipid panel  . Comprehensive metabolic panel  . CBC  . Ambulatory referral to Hematology  . EKG 12-Lead   Meds ordered this encounter  Medications  . losartan (COZAAR) 25 MG tablet    Sig: Take 1 tablet (25 mg total) by mouth daily.    Dispense:  90 tablet    Refill:  3  . hydrochlorothiazide (HYDRODIURIL) 12.5 MG tablet    Sig: Take 1 tablet (12.5 mg total) by mouth daily.    Dispense:  90 tablet    Refill:  3    Signed, Sanda Klein, MD  03/04/2017 9:14 AM    [place]

## 2017-03-03 NOTE — Patient Instructions (Addendum)
Medication Instructions: Dr Sallyanne Kuster has recommended making the following medication changes: 1. START Losartan 25 mg - take 1 tablet daily  Labwork: Your physician recommends that you return for lab work at your convenience - FASTING.  Testing/Procedures: NONE ORDERED  Follow-up: Dr Sallyanne Kuster recommends that you schedule a follow-up appointment in 3 months with an ICD check.  If you need a refill on your cardiac medications before your next appointment, please call your pharmacy.   You have been referred to Dr Annye Rusk for Factor V Leiden.   [facility] [address] [place], [place] [address] P: [phone] F: [phone] [email or web address]

## 2017-03-04 ENCOUNTER — Encounter: Payer: Self-pay | Admitting: Cardiovascular Disease

## 2017-03-04 DIAGNOSIS — I4901 Ventricular fibrillation: Secondary | ICD-10-CM | POA: Insufficient documentation

## 2017-03-04 DIAGNOSIS — Z7901 Long term (current) use of anticoagulants: Secondary | ICD-10-CM | POA: Insufficient documentation

## 2017-03-04 DIAGNOSIS — D6851 Activated protein C resistance: Secondary | ICD-10-CM | POA: Insufficient documentation

## 2017-03-04 DIAGNOSIS — E785 Hyperlipidemia, unspecified: Secondary | ICD-10-CM | POA: Insufficient documentation

## 2017-03-04 DIAGNOSIS — Z95810 Presence of automatic (implantable) cardiac defibrillator: Secondary | ICD-10-CM | POA: Insufficient documentation

## 2017-03-04 DIAGNOSIS — I2510 Atherosclerotic heart disease of native coronary artery without angina pectoris: Secondary | ICD-10-CM | POA: Insufficient documentation

## 2017-03-04 DIAGNOSIS — I5042 Chronic combined systolic (congestive) and diastolic (congestive) heart failure: Secondary | ICD-10-CM | POA: Insufficient documentation

## 2017-03-04 DIAGNOSIS — I69354 Hemiplegia and hemiparesis following cerebral infarction affecting left non-dominant side: Secondary | ICD-10-CM | POA: Insufficient documentation

## 2017-05-02 ENCOUNTER — Telehealth: Payer: Self-pay | Admitting: Cardiovascular Disease

## 2017-05-02 NOTE — Telephone Encounter (Signed)
New message  Pt mother verbalzied that she is calling for RN  She want to know if Dr.C has received his Disability forms   From the Social Security office in [place] and if the forms have been returned

## 2017-05-02 NOTE — Telephone Encounter (Signed)
Spoke to Roy Lozano in medical records she has not received a letter for Dr.Croitoru to sign,she  will ask Dr.Croitoru's CMA if she has received a letter and call mother back cell # [phone].

## 2017-05-02 NOTE — Telephone Encounter (Signed)
Returned call to patient's mother.She stated son has filed for disability.Stated she was calling to check on a form that was sent to Dr.Croitoru.Advised I will check with medical records and call her back.

## 2017-05-02 NOTE — Telephone Encounter (Signed)
New message  Pt mother verbalzied that she is calling for RN  She want to know if Dr.C has received his Disability forms   From [place] and if the forms have been returned

## 2017-05-05 DIAGNOSIS — Z95810 Presence of automatic (implantable) cardiac defibrillator: Secondary | ICD-10-CM | POA: Diagnosis not present

## 2017-05-05 DIAGNOSIS — I429 Cardiomyopathy, unspecified: Secondary | ICD-10-CM | POA: Diagnosis not present

## 2017-05-06 ENCOUNTER — Inpatient Hospital Stay: Payer: BLUE CROSS/BLUE SHIELD

## 2017-05-06 ENCOUNTER — Inpatient Hospital Stay: Payer: BLUE CROSS/BLUE SHIELD | Attending: Hematology & Oncology | Admitting: Hematology & Oncology

## 2017-05-06 VITALS — BP 140/96 | HR 64 | Temp 97.5°F | Resp 18 | Wt 189.0 lb

## 2017-05-06 DIAGNOSIS — I2510 Atherosclerotic heart disease of native coronary artery without angina pectoris: Secondary | ICD-10-CM

## 2017-05-06 DIAGNOSIS — D6851 Activated protein C resistance: Secondary | ICD-10-CM | POA: Diagnosis not present

## 2017-05-06 DIAGNOSIS — Z95810 Presence of automatic (implantable) cardiac defibrillator: Secondary | ICD-10-CM

## 2017-05-06 DIAGNOSIS — M6281 Muscle weakness (generalized): Secondary | ICD-10-CM | POA: Diagnosis not present

## 2017-05-06 DIAGNOSIS — E785 Hyperlipidemia, unspecified: Secondary | ICD-10-CM

## 2017-05-06 DIAGNOSIS — Z7901 Long term (current) use of anticoagulants: Secondary | ICD-10-CM

## 2017-05-06 DIAGNOSIS — I10 Essential (primary) hypertension: Secondary | ICD-10-CM

## 2017-05-06 MED ORDER — FOLIC ACID 1 MG PO TABS: 1.0000 mg | ORAL_TABLET | Freq: Every day | ORAL | 6 refills | Status: DC

## 2017-05-06 NOTE — Progress Notes (Signed)
Referral MD  Reason for Referral: Factor V Leiden mutation -- heterozygote  No chief complaint on file. : I had strokes and heart attacks.  HPI: Roy Lozano is a really nice 47 year old white male.  He has a very interesting past history.  He currently is on disability because of cerebrovascular and cardiovascular issues.  He said he was living in [place] back when he was 47 years old.  While playing basketball, his left side went numb.  At the time, he just went home.  The next day, he had a hard time talking.  He then went to a local hospital.  He obviously had a stroke.  However, he was not put on any aspirin or Plavix.  He was given a cortisone injection into his left shoulder.  He came back to the [place].  He went to [place].  He worked as a Chief Strategy Officer.  He also worked for [employer].  While living in [place], he has seizures.  He had cardiovascular disease.  He had a cardiac arrest.  He had a ICD put in.  He was found to have a factor V Leiden mutation.  He was heterozygous for the mutation.  Of note, we take care of his sister who also has a factor V Leiden mutation.  He sees cardiology.  He was referred to the [facility] because of this factor V Leiden.  He has some weakness in his left arm.  He is on Pradaxa.  He is on Plavix.  Since being placed on Pradaxa, he has had no cardiovascular or cerebrovascular episodes.  He moved back to [place] in February.  He now is establishing himself with doctors in this area.  He never smoked.  He does not do testosterone supplementation.  He is not a vegetarian.  He does have some memory issues.  He does have coronary artery stents.  He has no problems with bleeding.  There is no problems with bowel or bladder dysfunction.  He has had no nausea or vomiting.  He has not noted any leg swelling.  He has had no rashes.  He has had no dysphasia.  He does have an issue with hypertension and hyperlipidemia.   Cardiology is taking care of this.  He does feel cold all the time.  Overall, I said his performance status is ECOG 1-2.   Past Medical History:  Diagnosis Date  . Arrhythmia   . Clotting disorder ([place])    factor 5 Leiden   . Coronary artery disease   . Heart disease   . Hyperlipidemia   . Seizures ([place])   . Stroke [facility])   :  Past Surgical History:  Procedure Laterality Date  . ICD IMPLANT Left 04/16/2016   Medtronic  :   Current Outpatient Medications:  .  atorvastatin (LIPITOR) 80 MG tablet, Take 1 tablet by mouth every evening., Disp: , Rfl: 0 .  clopidogrel (PLAVIX) 75 MG tablet, Take 1 tablet by mouth daily., Disp: , Rfl:  .  dabigatran (PRADAXA) 150 MG CAPS capsule, Take 1 capsule by mouth 2 (two) times daily., Disp: , Rfl:  .  hydrochlorothiazide (HYDRODIURIL) 12.5 MG tablet, Take 1 tablet (12.5 mg total) by mouth daily., Disp: 90 tablet, Rfl: 3 .  Levetiracetam 750 MG TB24, Take 1 tablet by mouth 2 (two) times daily., Disp: , Rfl: 0 .  losartan (COZAAR) 25 MG tablet, Take 1 tablet (25 mg total) by mouth daily., Disp: 90 tablet, Rfl: 3 .  metoprolol succinate (TOPROL-XL) 50 MG 24 hr tablet, Take 1 tablet by mouth daily., Disp: , Rfl:  .  nitroGLYCERIN (NITROSTAT) 0.4 MG SL tablet, Place 1 tablet under the tongue as needed. X 3 doses, Disp: , Rfl:  .  traZODone (DESYREL) 50 MG tablet, Take 1 tablet by mouth at bedtime., Disp: , Rfl: 0:  :  No Known Allergies:  Family History  Problem Relation Age of Onset  . Heart attack Father   . Stroke Maternal Grandmother   . Stroke Maternal Grandfather   . Heart disease Paternal Grandmother   . Heart disease Paternal Grandfather   :  Social History   Socioeconomic History  . Marital status: Single    Spouse name: Not on file  . Number of children: Not on file  . Years of education: Not on file  . Highest education level: Not on file  Occupational History  . Not on file  Social Needs  . Financial resource strain:  Not on file  . Food insecurity:    Worry: Not on file    Inability: Not on file  . Transportation needs:    Medical: Not on file    Non-medical: Not on file  Tobacco Use  . Smoking status: Former Smoker    Types: Cigarettes    Start date: 03/03/2016  . Smokeless tobacco: Never Used  Substance and Sexual Activity  . Alcohol use: Yes    Comment: rarely  . Drug use: No  . Sexual activity: Not on file  Lifestyle  . Physical activity:    Days per week: Not on file    Minutes per session: Not on file  . Stress: Not on file  Relationships  . Social connections:    Talks on phone: Not on file    Gets together: Not on file    Attends religious service: Not on file    Active member of club or organization: Not on file    Attends meetings of clubs or organizations: Not on file    Relationship status: Not on file  . Intimate partner violence:    Fear of current or ex partner: Not on file    Emotionally abused: Not on file    Physically abused: Not on file    Forced sexual activity: Not on file  Other Topics Concern  . Not on file  Social History Narrative  . Not on file  :  Review of Systems  Constitutional: Negative.   HENT: Negative.   Eyes: Negative.   Respiratory: Negative.   Cardiovascular: Positive for palpitations and orthopnea.  Gastrointestinal: Negative.   Genitourinary: Negative.   Musculoskeletal: Negative.   Skin: Negative.   Neurological: Positive for speech change, focal weakness and seizures.  Endo/Heme/Allergies: Negative.   Psychiatric/Behavioral: The patient is nervous/anxious.      Exam: Thin but well-nourished white male in no obvious distress.  Vital signs show temperature of 97.5.  Pulse 64.  Blood pressure 140/96.  Weight is 189 pounds.  Head neck exam shows no ocular or oral lesions.  He has no palpable cervical or supraclavicular lymph nodes.  Lungs are clear bilaterally.  Cardiac exam regular rate and rhythm with a normal S1 and S2.  He has no  murmurs, rubs or bruits.  Abdomen is soft.  He has good bowel sounds.  There is no fluid wave.  There is no palpable liver or spleen tip.  Back exam shows no tenderness over the spine, ribs or hips.  Extremities  shows weakness in the left arm.  He has good strength and range of motion of his other extremities.  Neurological exam shows a weakness on his left arm.  Skin exam shows some hyperpigmented lesions.  He has a couple skin tags.  He has no rashes. @IPVITALS @   No results for input(s): WBC, HGB, HCT, PLT in the last 72 hours. No results for input(s): NA, K, CL, CO2, GLUCOSE, BUN, CREATININE, CALCIUM in the last 72 hours.  Blood smear review: None  Pathology: None    Assessment and Plan: [name] is a 47 year old white male.  He has a factor V Leiden mutation.  He has had cardiovascular and cerebrovascular problems.  I would have to believe that this factor V Leiden is a factor with these episodes.  The fact that he had a stroke when he was 47 years old is, I think, the biggest indicator that the factor V Leiden has been "active."  He is on Plavix.  He is on Pradaxa.  I know this is a very aggressive combination.  However, I think that he has shown that he needs anticoagulation and antiplatelet blockade.  I really do not see any changes that we need to be made.  I cannot think of any blood test that we need to do.  I would suspect that he probably has an elevated homocystine level.  I think would be worthwhile having him on folic acid.  I talked to him about this.  He will be on 1 mg daily.  I would like to see him back in about 3 months.  I think if all looks good in 3 months, then we can get him back once or twice a year.  Ms. Wilmes is a very nice.  It was really fun talking with him.  I spent about 45 minutes with him.  Over half the time was spent face-to-face.

## 2017-05-07 ENCOUNTER — Other Ambulatory Visit: Payer: Self-pay | Admitting: *Deleted

## 2017-05-07 DIAGNOSIS — D6851 Activated protein C resistance: Secondary | ICD-10-CM

## 2017-05-26 ENCOUNTER — Ambulatory Visit: Payer: BLUE CROSS/BLUE SHIELD | Admitting: Cardiovascular Disease

## 2017-05-26 ENCOUNTER — Encounter: Payer: Self-pay | Admitting: Cardiovascular Disease

## 2017-05-26 VITALS — BP 130/100 | HR 65 | Ht 74.0 in | Wt 185.6 lb

## 2017-05-26 DIAGNOSIS — E785 Hyperlipidemia, unspecified: Secondary | ICD-10-CM | POA: Diagnosis not present

## 2017-05-26 DIAGNOSIS — Z95810 Presence of automatic (implantable) cardiac defibrillator: Secondary | ICD-10-CM | POA: Diagnosis not present

## 2017-05-26 DIAGNOSIS — D6851 Activated protein C resistance: Secondary | ICD-10-CM

## 2017-05-26 DIAGNOSIS — I5022 Chronic systolic (congestive) heart failure: Secondary | ICD-10-CM

## 2017-05-26 DIAGNOSIS — I4901 Ventricular fibrillation: Secondary | ICD-10-CM | POA: Diagnosis not present

## 2017-05-26 DIAGNOSIS — Z7901 Long term (current) use of anticoagulants: Secondary | ICD-10-CM | POA: Diagnosis not present

## 2017-05-26 DIAGNOSIS — I2510 Atherosclerotic heart disease of native coronary artery without angina pectoris: Secondary | ICD-10-CM | POA: Diagnosis not present

## 2017-05-26 DIAGNOSIS — I69354 Hemiplegia and hemiparesis following cerebral infarction affecting left non-dominant side: Secondary | ICD-10-CM

## 2017-05-26 DIAGNOSIS — Z79899 Other long term (current) drug therapy: Secondary | ICD-10-CM | POA: Diagnosis not present

## 2017-05-26 MED ORDER — CLOPIDOGREL BISULFATE 75 MG PO TABS: 75.0000 mg | ORAL_TABLET | Freq: Every day | ORAL | 3 refills | Status: DC

## 2017-05-26 MED ORDER — LOSARTAN POTASSIUM 50 MG PO TABS: 50.0000 mg | ORAL_TABLET | Freq: Every day | ORAL | 3 refills | Status: DC

## 2017-05-26 NOTE — Patient Instructions (Signed)
Medication Instructions: Dr Sallyanne Kuster has recommended making the following medication changes: 1. INCREASE Losartan to 50 mg daily  Labwork: Your physician recommends that you return for lab work TODAY.  Testing/Procedures: 1. Echocardiogram - Your physician has requested that you have an echocardiogram. Echocardiography is a painless test that uses sound waves to create images of your heart. It provides your doctor with information about the size and shape of your heart and how well your heart's chambers and valves are working. This procedure takes approximately one hour. There are no restrictions for this procedure. >>This will be performed at our [facility] location [place], [place] [address] [phone]  Follow-up: Dr Sallyanne Kuster recommends that you schedule a follow-up appointment in 3 months with an ICD check.  If you need a refill on your cardiac medications before your next appointment, please call your pharmacy.

## 2017-05-26 NOTE — Progress Notes (Signed)
Cardiology Office Note:    Date:  05/26/2017   ID:  Roy Lozano, DOB September 12, 1970, MRN [id number]  PCP:  Patient, No Pcp Per  Cardiologist:  Royale Lennartz  Referring MD: No ref. provider found   Chief Complaint  Patient presents with  . Follow-up    CHF, CAD, VF    History of Present Illness:    Roy Lozano is a 47 y.o. male with a hx of previous myocardial infarction (suspect anterior STEMI), history of cardiac arrest in April 2018, followed by implantation of a single-chamber defibrillator with 2 subsequent appropriate defibrillator discharges for ventricular fibrillation, most recently in January 2019.  (while living in [place]).    We have received his ICD report results from Roy Lozano but still have not received the requested cardiac catheterization and echo reports or any clinical data  His device is a Medtronic Visia AF MRI VR device.  It was implanted on April 16, 2016, estimated longevity another 10.9 years.  All lead parameters are excellent.  There is no ventricular pacing.  The activity level is fairly constant at 1-1.5 hours/day.  The last 2-3 months he shows a nice distinction and heart rate between daytime and nighttime all lead parameters are excellent..The device is programmed as a backup VVI 40 bpm shock box.    Interrogation of the device shows that he has delivered 2 shocks.  1 of them was last November and the details have been deleted from the device memory.  The other one occurred on January 29 at 0351 hrs.  He had abrupt onset of what appears to be polymorphic VT/VF with a cycle length varying between 130 and 190 ms and appropriate single defibrillator discharge at 36 J to convert the rhythm to ventricular pacing.  He was unaware of the discharge which occurred in the middle of the night.  He was warned of its occurrence due to the device alarms.  The patient specifically denies any chest pain at rest exertion, dyspnea at rest or with light exertion,  orthopnea, paroxysmal nocturnal dyspnea, syncope, palpitations, focal neurological deficits, intermittent claudication, lower extremity edema, unexplained weight gain, cough, hemoptysis or wheezing.  Overall seems to have NYHA functional class II.  Unfortunately, Roy Lozano also has some mild-moderate memory issues since his cardiac arrest and has not always available to provide details about his medical problems.  He is clearly a very intelligent and educated gentleman, he is here alone today.  He reports that his cardiac arrest occurred during hospitalization for pneumonia in April 2018.   It is not clear about the sequence of events but he had a large heart attack of the front wall of his heart.  He received stents.  He has also had a stroke which has left him with mild left-sided hemiparesis.  He states that his left hand is always cold.  He has limited dexterity and ability to grasp without limb and is applying for disability based on this.  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  He is now living with his 23 year old mother.  In addition to his cardiac problem has a history of epilepsy and factor V Leiden deficiency.  He reports having had a previous stroke at age 39.  His seizure disorder took a long time to diagnose accurately and treat, but is currently well controlled with antiepileptic medications.  He has a history of hypertension and hyperlipidemia.  He smoked very briefly and in very small amounts: roughly a pack every 2 weeks for 1-1/2 years, quit  2018.  His list of medications includes clopidogrel and Pradaxa as well as a beta-blocker and low dose angiotensin receptor blocker.  He has been prescribed high-dose atorvastatin.  Past Medical History:  Diagnosis Date  . Arrhythmia   . Clotting disorder ([place])    factor 5 Leiden   . Coronary artery disease   . Heart disease   . Hyperlipidemia   . Seizures ([place])   . Stroke [facility])     Past Surgical History:  Procedure Laterality Date  . ICD  IMPLANT Left 04/16/2016   Medtronic    Current Medications:  Current Outpatient Medications on File Prior to Visit  Medication Sig Dispense Refill  . atorvastatin (LIPITOR) 80 MG tablet Take 1 tablet by mouth every evening.  0  . dabigatran (PRADAXA) 150 MG CAPS capsule Take 1 capsule by mouth 2 (two) times daily.    . folic acid (FOLVITE) 1 MG tablet Take 1 tablet (1 mg total) by mouth daily. 90 tablet 6  . hydrochlorothiazide (HYDRODIURIL) 12.5 MG tablet Take 1 tablet (12.5 mg total) by mouth daily. 90 tablet 3  . Levetiracetam 750 MG TB24 Take 1 tablet by mouth 2 (two) times daily.  0  . metoprolol succinate (TOPROL-XL) 50 MG 24 hr tablet Take 1 tablet by mouth daily.    . nitroGLYCERIN (NITROSTAT) 0.4 MG SL tablet Place 1 tablet under the tongue as needed. X 3 doses    . traZODone (DESYREL) 50 MG tablet Take 1 tablet by mouth at bedtime.  0   No current facility-administered medications on file prior to visit.       Allergies:   Patient has no known allergies.   Social History   Socioeconomic History  . Marital status: Single    Spouse name: Not on file  . Number of children: Not on file  . Years of education: Not on file  . Highest education level: Not on file  Occupational History  . Not on file  Social Needs  . Financial resource strain: Not on file  . Food insecurity:    Worry: Not on file    Inability: Not on file  . Transportation needs:    Medical: Not on file    Non-medical: Not on file  Tobacco Use  . Smoking status: Former Smoker    Types: Cigarettes    Start date: 03/03/2016  . Smokeless tobacco: Never Used  Substance and Sexual Activity  . Alcohol use: Yes    Comment: rarely  . Drug use: No  . Sexual activity: Not on file  Lifestyle  . Physical activity:    Days per week: Not on file    Minutes per session: Not on file  . Stress: Not on file  Relationships  . Social connections:    Talks on phone: Not on file    Gets together: Not on file     Attends religious service: Not on file    Active member of club or organization: Not on file    Attends meetings of clubs or organizations: Not on file    Relationship status: Not on file  Other Topics Concern  . Not on file  Social History Narrative  . Not on file     Family History: The patient's family history includes Heart attack in his father; Heart disease in his paternal grandfather and paternal grandmother; Stroke in his maternal grandfather and maternal grandmother.  ROS:   Please see the history of present illness.  All other systems  reviewed and are negative.  EKGs/Labs/Other Studies Reviewed:    The following studies were reviewed today: ICD check  EKG:  EKG is  Not ordered today.    Recent Labs: No results found for requested labs within last 8760 hours.  Recent Lipid Panel No results found for: CHOL, TRIG, HDL, CHOLHDL, VLDL, LDLCALC, LDLDIRECT  Physical Exam:    VS:  BP (!) 130/100   Pulse 65   Ht 6' 2"  (1.88 m)   Wt 185 lb 9.6 oz (84.2 kg)   SpO2 96%   BMI 23.83 kg/m     Wt Readings from Last 3 Encounters:  05/26/17 185 lb 9.6 oz (84.2 kg)  05/06/17 189 lb (85.7 kg)  03/03/17 191 lb 9.6 oz (86.9 kg)      General: Alert, oriented x3, no distress, lean Head: no evidence of trauma, PERRL, EOMI, no exophtalmos or lid lag, no myxedema, no xanthelasma; normal ears, nose and oropharynx Neck: normal jugular venous pulsations and no hepatojugular reflux; brisk carotid pulses without delay and no carotid bruits Chest: clear to auscultation, no signs of consolidation by percussion or palpation, normal fremitus, symmetrical and full respiratory excursions Cardiovascular: normal position and quality of the apical impulse, regular rhythm, normal first and second heart sounds, no murmurs, rubs or gallops Abdomen: no tenderness or distention, no masses by palpation, no abnormal pulsatility or arterial bruits, normal bowel sounds, no hepatosplenomegaly Extremities:  no clubbing, cyanosis or edema;  The left hand is slightly cyanotic and cool compared to the right side which is pink and warm.  Nevertheless he has normal radial and ulnar pulses; 2+ right femoral, posterior tibial and dorsalis pedis pulses; 2+ left femoral, posterior tibial and dorsalis pedis pulses; no subclavian or femoral bruits Neurological: Alert and oriented x 3, mild 4/5 weakness in the left upper extremity, mild loss of dexterity in the left hand slight ataxia.  Subtle difficulty with memory is apparent.  Otherwise nonfocal neurological exam Psych: Affect and mood are normal   ASSESSMENT:    1. VF (ventricular fibrillation) ([place])   2. Coronary artery disease involving native coronary artery of native heart without angina pectoris   3. CHF (congestive heart failure), NYHA class I, chronic, systolic ([place])   4. Dyslipidemia   5. Factor V Leiden ([place])   6. Long term (current) use of anticoagulants   7. Hemiparesis affecting left side as late effect of stroke ([place])   8. ICD (implantable cardioverter-defibrillator) in place   9. Medication management    PLAN:    In order of problems listed above:  1. VF: Since the last appointment in February this year he has not had an any episodes of sustained or nonsustained VT.  Review of his previous ICD download dated January 29, 2017 shows an episode of ventricular fibrillation with a cycle length less than 200 bpm leading to an appropriate single defibrillator discharge with conversion to ventricular paced rhythm . ECG is consistent with a previous anteroseptal myocardial infarction, possibly with some degree of aneurysm formation as supported by the presence of residual ST segment elevation.  It is possible that this is the nidus for repeated episodes of ventricular arrhythmia.  It is possible that his last episode of ventricular fibrillation was related to decompensation of heart failure, since it was preceded by a loss of normal day-night heart  rate gradient 2. CAD: He does not have angina.  He is on antiplatelet and statin therapy as well as beta-blockers.  We still do not  have the report of his catheterization 2018, when I suspect that he received a stent.  Thankfully he has not had any bleeding complications on combination antiplatelet and anticoagulant therapy.  Until we get more details about his coronary anatomy, we will continue clopidogrel, in case he had other non-revascularized high risk lesions.  However, 12 months after the original stent he will no longer need to mandatory antiplatelet therapy. 3. CHF: Again, do not have his echo report, will request again records from [place].  Either way, he should have an updated echocardiogram.  Continue beta-blockers and increase losartan.  Plan to switch to [facility] if LVEF is under 40%.  Clinically he appears to be euvolemic, NYHA functional class II, without need for loop diuretics. 4. Factor V Leiden: Evaluated by Dr. Marin Olp.  He is in agreement with the choice of oral anticoagulant and feels that his procoagulant condition has been important feature stroke at age 43. 5. Pradaxa: Well-tolerated, no bleeding 6. L hemiparesis: Loss of dexterity impairs his ability to use devices such as keyboard and computer mouse. Together with his memory impairment, this will make it very challenging to have a job. 7. HLP: Draw lipid profile today.  On high-dose statin. 8. ICD: Normal device function.  Has had 2 appropriate shocks for ventricular fibrillation.  On beta-blockers.  Refer to EP once we have completed his repeat work-up.  Medication Adjustments/Labs and Tests Ordered: Current medicines are reviewed at length with the patient today.  Concerns regarding medicines are outlined above.  Orders Placed This Encounter  Procedures  . Comprehensive metabolic panel  . Lipid panel  . ECHOCARDIOGRAM COMPLETE   Meds ordered this encounter  Medications  . clopidogrel (PLAVIX) 75 MG tablet     Sig: Take 1 tablet (75 mg total) by mouth daily.    Dispense:  90 tablet    Refill:  3  . losartan (COZAAR) 50 MG tablet    Sig: Take 1 tablet (50 mg total) by mouth daily.    Dispense:  90 tablet    Refill:  3    Signed, Sanda Klein, MD  05/26/2017 5:19 PM    [place]

## 2017-05-27 LAB — COMPREHENSIVE METABOLIC PANEL
A/G RATIO: 1.8 (ref 1.2–2.2)
ALT: 37 IU/L (ref 0–44)
AST: 30 IU/L (ref 0–40)
Albumin: 4.8 g/dL (ref 3.5–5.5)
Alkaline Phosphatase: 124 IU/L — ABNORMAL HIGH (ref 39–117)
BILIRUBIN TOTAL: 0.8 mg/dL (ref 0.0–1.2)
BUN / CREAT RATIO: 10 (ref 9–20)
BUN: 13 mg/dL (ref 6–24)
CHLORIDE: 99 mmol/L (ref 96–106)
CO2: 27 mmol/L (ref 20–29)
Calcium: 9.4 mg/dL (ref 8.7–10.2)
Creatinine, Ser: 1.24 mg/dL (ref 0.76–1.27)
GFR, EST AFRICAN AMERICAN: 80 mL/min/{1.73_m2} (ref >59)
GFR, EST NON AFRICAN AMERICAN: 69 mL/min/{1.73_m2} (ref >59)
Globulin, Total: 2.6 g/dL (ref 1.5–4.5)
Glucose: 89 mg/dL (ref 65–99)
POTASSIUM: 4 mmol/L (ref 3.5–5.2)
Sodium: 142 mmol/L (ref 134–144)
Total Protein: 7.4 g/dL (ref 6.0–8.5)

## 2017-05-27 LAB — LIPID PANEL
CHOL/HDL RATIO: 4.5 ratio (ref 0.0–5.0)
Cholesterol, Total: 136 mg/dL (ref 100–199)
HDL: 30 mg/dL — ABNORMAL LOW (ref >39)
LDL Calculated: 89 mg/dL (ref 0–99)
Triglycerides: 85 mg/dL (ref 0–149)
VLDL Cholesterol Cal: 17 mg/dL (ref 5–40)

## 2017-05-30 ENCOUNTER — Telehealth: Payer: Self-pay | Admitting: Cardiovascular Disease

## 2017-05-30 DIAGNOSIS — Z79899 Other long term (current) drug therapy: Secondary | ICD-10-CM

## 2017-05-30 DIAGNOSIS — E785 Hyperlipidemia, unspecified: Secondary | ICD-10-CM

## 2017-05-30 MED ORDER — EZETIMIBE 10 MG PO TABS: 10.0000 mg | ORAL_TABLET | Freq: Every day | ORAL | 3 refills | Status: DC

## 2017-05-30 NOTE — Telephone Encounter (Signed)
Notes recorded by Fidel Levy, RN on 05/27/2017 at 3:48 PM EDT LMTCB on [phone] (Home) ------  Notes recorded by Sanda Klein, MD on 05/27/2017 at 11:07 AM EDT Routine labs are all normal, except for borderline elevated alk phos ( of uncertain significance, could be liver or bone origin, would just recheck in a few months and will send copy to PCP - does he have one yet?). LDL cholesterol is above target of 70, despite max dose atorvastatin. I would like to add ezetimibe 10 mg daily, recheck in 3 months. Low HDL will not improve with meds - need physical activity and weight loss.   Spoke with pt and advised of lab results and Dr. Victorino December recommendation. Pt verbalized understanding. New prescription sent to pharmacy. Lab orders mailed to pt.

## 2017-05-30 NOTE — Telephone Encounter (Signed)
Pt calling   Retuning call from nurse concerning lab results. Please call pt.

## 2017-06-04 ENCOUNTER — Ambulatory Visit (HOSPITAL_COMMUNITY): Payer: BLUE CROSS/BLUE SHIELD | Attending: Cardiovascular Disease

## 2017-06-04 ENCOUNTER — Other Ambulatory Visit: Payer: Self-pay

## 2017-06-04 DIAGNOSIS — D6851 Activated protein C resistance: Secondary | ICD-10-CM | POA: Insufficient documentation

## 2017-06-04 DIAGNOSIS — I5022 Chronic systolic (congestive) heart failure: Secondary | ICD-10-CM | POA: Diagnosis not present

## 2017-06-04 DIAGNOSIS — I4901 Ventricular fibrillation: Secondary | ICD-10-CM | POA: Diagnosis not present

## 2017-06-04 DIAGNOSIS — E785 Hyperlipidemia, unspecified: Secondary | ICD-10-CM | POA: Insufficient documentation

## 2017-06-04 DIAGNOSIS — Z8673 Personal history of transient ischemic attack (TIA), and cerebral infarction without residual deficits: Secondary | ICD-10-CM | POA: Insufficient documentation

## 2017-06-04 DIAGNOSIS — I517 Cardiomegaly: Secondary | ICD-10-CM | POA: Insufficient documentation

## 2017-06-04 DIAGNOSIS — I252 Old myocardial infarction: Secondary | ICD-10-CM | POA: Insufficient documentation

## 2017-06-04 DIAGNOSIS — I509 Heart failure, unspecified: Secondary | ICD-10-CM | POA: Diagnosis not present

## 2017-06-04 MED ORDER — PERFLUTREN LIPID MICROSPHERE
1.0000 mL | INTRAVENOUS | Status: AC | PRN
  Administered 2017-06-04: 2 mL via INTRAVENOUS

## 2017-07-03 ENCOUNTER — Ambulatory Visit: Payer: BLUE CROSS/BLUE SHIELD | Admitting: Pharmacist Clinician (PhC)/ Clinical Pharmacy Specialist

## 2017-07-03 DIAGNOSIS — I5022 Chronic systolic (congestive) heart failure: Secondary | ICD-10-CM

## 2017-07-03 DIAGNOSIS — Z79899 Other long term (current) drug therapy: Secondary | ICD-10-CM | POA: Diagnosis not present

## 2017-07-03 DIAGNOSIS — E785 Hyperlipidemia, unspecified: Secondary | ICD-10-CM | POA: Diagnosis not present

## 2017-07-03 LAB — COMPREHENSIVE METABOLIC PANEL
ALT: 69 IU/L — ABNORMAL HIGH (ref 0–44)
AST: 45 IU/L — ABNORMAL HIGH (ref 0–40)
Albumin/Globulin Ratio: 1.9 (ref 1.2–2.2)
Albumin: 4.7 g/dL (ref 3.5–5.5)
Alkaline Phosphatase: 132 IU/L — ABNORMAL HIGH (ref 39–117)
BUN / CREAT RATIO: 7 — AB (ref 9–20)
BUN: 8 mg/dL (ref 6–24)
Bilirubin Total: 0.8 mg/dL (ref 0.0–1.2)
CALCIUM: 9.9 mg/dL (ref 8.7–10.2)
CO2: 24 mmol/L (ref 20–29)
CREATININE: 1.11 mg/dL (ref 0.76–1.27)
Chloride: 99 mmol/L (ref 96–106)
GFR calc non Af Amer: 79 mL/min/{1.73_m2} (ref >59)
GFR, EST AFRICAN AMERICAN: 92 mL/min/{1.73_m2} (ref >59)
GLUCOSE: 83 mg/dL (ref 65–99)
Globulin, Total: 2.5 g/dL (ref 1.5–4.5)
Potassium: 4.1 mmol/L (ref 3.5–5.2)
Sodium: 140 mmol/L (ref 134–144)
TOTAL PROTEIN: 7.2 g/dL (ref 6.0–8.5)

## 2017-07-03 LAB — LIPID PANEL
CHOL/HDL RATIO: 3.5 ratio (ref 0.0–5.0)
Cholesterol, Total: 118 mg/dL (ref 100–199)
HDL: 34 mg/dL — ABNORMAL LOW (ref >39)
LDL CALC: 70 mg/dL (ref 0–99)
TRIGLYCERIDES: 71 mg/dL (ref 0–149)
VLDL Cholesterol Cal: 14 mg/dL (ref 5–40)

## 2017-07-03 MED ORDER — METOPROLOL SUCCINATE ER 50 MG PO TB24: 50.0000 mg | ORAL_TABLET | Freq: Every day | ORAL | 3 refills | Status: DC

## 2017-07-03 MED ORDER — DABIGATRAN ETEXILATE MESYLATE 150 MG PO CAPS: 150.0000 mg | ORAL_CAPSULE | Freq: Two times a day (BID) | ORAL | 1 refills | Status: DC

## 2017-07-03 NOTE — Progress Notes (Signed)
07/04/2017 Roy Lozano [birth year] [id number]   HPI:  Roy Lozano is a 47 y.o. male patient of Dr Sallyanne Kuster, with a PMH below who presents today for hypertension clinic and possible Entresto start.  He has a complex medical history including a suspected anterior MI (date unknown, stents placed), cardiac arrest,  (04/2016), ICD implantation (with 2 discharges), chronic systolic heart failure (NYHA class II), prior stroke (with mild left hemiparesis, he also mentions earlier stroke at age 89), epilepsy, Factor V Leiden, and hyperlipidemia.    He has just recently moved to the [place] area from [place], so not all of his past medical records are available.  Today he comes in with all his medication bottles.  Of note, there is no metoprolol and the name does not ring a bell with him.    Blood Pressure Goal:  130/80  Current Medications:  hctz 12.5 mg qd -   Losartan 50 mg qd  Metoprolol succ 50 mg qd - unsure if he is still taking  Family Hx:  Father - passed in early 20's from [place], 32th, first was late 93's  MGP both died from strokes, grandfather around age 43, grandmother in her 42s  1 sister with FVL  Social Hx:  Prior smoker, no alcohol, maybe 1 Dr. 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 atorvastatin (LIPITOR) 80 MG tablet Take 1 tablet by mouth every evening.  0  . clopidogrel (PLAVIX) 75 MG tablet Take 1 tablet (75 mg total) by mouth daily. 90 tablet 3  . dabigatran (PRADAXA) 150 MG CAPS capsule Take 1 capsule (150 mg total) by mouth 2 (two) times daily. 180 capsule 1  . ezetimibe (ZETIA) 10 MG tablet Take 1 tablet (10 mg total) by mouth daily. 90 tablet 3  . folic acid (FOLVITE) 1 MG tablet Take 1 tablet (1 mg total) by mouth daily. 90 tablet 6  . hydrochlorothiazide (HYDRODIURIL) 12.5 MG tablet Take 1 tablet (12.5 mg total) by mouth daily. 90 tablet 3  . Levetiracetam 750 MG TB24 Take 1 tablet by mouth 2 (two) times daily.  0  . losartan (COZAAR) 50 MG tablet Take 1 tablet (50 mg total) by mouth daily. 90 tablet 3  . metoprolol succinate (TOPROL-XL) 50 MG 24 hr tablet Take 1 tablet (50 mg total) by mouth daily. Take with or immediately following a meal. 90 tablet 3  . nitroGLYCERIN (NITROSTAT) 0.4 MG SL tablet Place 1 tablet under the tongue as needed. X 3 doses    . traZODone (DESYREL) 50 MG tablet Take 1 tablet by mouth at bedtime.  0   No current facility-administered medications for this visit.  No Known Allergies  Past Medical History:  Diagnosis Date  . Arrhythmia   . Clotting disorder ([place])    factor 5 Leiden   . Coronary artery disease   . Heart disease   . Hyperlipidemia   . Seizures ([place])   . Stroke ([place])     Blood pressure (!) 122/96.  CHF (congestive heart failure), NYHA class I, chronic, systolic ([facility]) Patient with diastolic hypertension and heart failure, currently on losartan and hydrochlorothiazide.  Apparently he is not currently taking metoprolol.  His blood pressure is improved today, but we need to get him back on his beta blocker.  Will reinstate metoprolol 50 mg daily.  He is to start checking home blood pressure daily at home and bring in a list of home readings as well as the home meter.  At that time, we should be  able to stop the losartan and start Entresto 24/26 mg.    Tommy Medal PharmD CPP [facility] [address] [place], [place] [address] [phone]

## 2017-07-03 NOTE — Patient Instructions (Addendum)
Return for a a follow up appointment in 1 month Call with any questions or concerns.  Kristin/Raquel at [phone]  Your blood pressure today is 122/96 (goal is < 130/80)   Check your blood pressure at home daily and keep record of the readings.  Take your BP meds as follows:  Continue with losartan and hydrochlorothiazide each morning  Take Metoprolol 50 mg each evening  Bring all of your meds, your BP cuff and your record of home blood pressures to your next appointment.  Exercise as you're able, try to walk approximately 30 minutes per day.  Keep salt intake to a minimum, especially watch canned and prepared boxed foods.  Eat more fresh fruits and vegetables and fewer canned items.  Avoid eating in fast food restaurants.    HOW TO TAKE YOUR BLOOD PRESSURE: . Rest 5 minutes before taking your blood pressure. .  Don't smoke or drink caffeinated beverages for at least 30 minutes before. . Take your blood pressure before (not after) you eat. . Sit comfortably with your back supported and both feet on the floor (don't cross your legs). . Elevate your arm to heart level on a table or a desk. . Use the proper sized cuff. It should fit smoothly and snugly around your bare upper arm. There should be enough room to slip a fingertip under the cuff. The bottom edge of the cuff should be 1 inch above the crease of the elbow. . Ideally, take 3 measurements at one sitting and record the average.

## 2017-07-04 ENCOUNTER — Encounter: Payer: Self-pay | Admitting: Pharmacist Clinician (PhC)/ Clinical Pharmacy Specialist

## 2017-07-04 NOTE — Assessment & Plan Note (Addendum)
Patient with diastolic hypertension and heart failure, currently on losartan and hydrochlorothiazide.  Apparently he is not currently taking metoprolol.  His blood pressure is improved today, but we need to get him back on his beta blocker.  Will reinstate metoprolol 50 mg daily.  He is to start checking home blood pressure daily at home and bring in a list of home readings as well as the home meter.  At that time, we should be able to stop the losartan and start Entresto 24/26 mg.

## 2017-07-04 NOTE — Progress Notes (Signed)
Thank you MCr 

## 2017-07-08 ENCOUNTER — Telehealth: Payer: Self-pay

## 2017-07-08 DIAGNOSIS — R945 Abnormal results of liver function studies: Secondary | ICD-10-CM

## 2017-07-08 DIAGNOSIS — R7989 Other specified abnormal findings of blood chemistry: Secondary | ICD-10-CM

## 2017-07-08 NOTE — Telephone Encounter (Signed)
Patient returned call. Results reviewed. Patient verbalized understanding and agreed with plan.  Repeat lab ordered.

## 2017-07-08 NOTE — Telephone Encounter (Signed)
-----   Message from Thurmon FairMihai Croitoru, MD sent at 07/04/2017 10:21 AM EDT ----- Very mild abnormality in liver tests. Lipids are good. Kidney parameters better. I would just repeat CMET in 6 weeks please. He has a clinical Pharm D appt in July when that can be done.

## 2017-07-31 ENCOUNTER — Ambulatory Visit: Payer: BLUE CROSS/BLUE SHIELD

## 2017-07-31 NOTE — Progress Notes (Deleted)
07/31/2017 Roy Lozano Jan 09, 1970 [id number]   HPI:  Roy Lozano is a 47 y.o. male patient of Dr Roy Lozano, with a PMH below who presents today for hypertension clinic and possible Entresto start.  He has a complex medical history including a suspected anterior MI (date unknown, stents placed), cardiac arrest,  (04/2016), ICD implantation (with 2 discharges), chronic systolic heart failure (NYHA class II), prior stroke (with mild left hemiparesis, he also mentions earlier stroke at age 66), epilepsy, Factor V Leiden, and hyperlipidemia.  He recently moved to the [place] area from [place], so not all of his medical records are currently available.    At his last visit he did not have a bottle of metoprolol, although it was on his medication list.  The name did not sound familiar to him.  I restarted him on metoprolol succinate 50 mg once daily, and he returns today for follow up.    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 atorvastatin (LIPITOR) 80 MG tablet Take 1 tablet by mouth every evening.  0  . clopidogrel (PLAVIX) 75 MG tablet Take 1 tablet (75 mg total) by mouth daily. 90 tablet 3  . dabigatran (PRADAXA) 150 MG CAPS capsule Take 1 capsule (150 mg total) by mouth 2 (two) times daily. 180 capsule 1  . ezetimibe (ZETIA) 10 MG tablet Take 1 tablet (10 mg total) by mouth daily. 90 tablet 3  . folic acid (FOLVITE) 1 MG tablet Take 1 tablet (1 mg total) by mouth daily. 90 tablet 6  . hydrochlorothiazide (HYDRODIURIL) 12.5 MG tablet Take 1 tablet (12.5 mg total) by mouth daily. 90 tablet 3  . Levetiracetam 750 MG TB24 Take 1 tablet by mouth 2 (two) times daily.  0  . losartan (COZAAR) 50 MG tablet Take 1 tablet (50 mg total) by mouth daily. 90 tablet 3  . metoprolol succinate (TOPROL-XL) 50 MG 24 hr tablet Take 1 tablet (50 mg total) by mouth daily. Take with or immediately following a meal. 90 tablet 3  . nitroGLYCERIN (NITROSTAT) 0.4 MG SL tablet Place 1 tablet under the tongue as needed. X 3 doses    . traZODone (DESYREL) 50 MG  tablet Take 1 tablet by mouth at bedtime.  0   No current facility-administered medications for this visit.     No Known Allergies  Past Medical History:  Diagnosis Date  . Arrhythmia   . Clotting disorder ([facility])    factor 5 Leiden   . Coronary artery disease   . Heart disease   . Hyperlipidemia   . Seizures ([facility])   . Stroke [facility])     There were no vitals taken for this visit.  No problem-specific Assessment & Plan notes found for this encounter.   Phillips HayKristin Tammy Lozano PharmD CPP [facility] [address] [place], [place] [id number] [phone]

## 2017-08-04 DIAGNOSIS — Z95810 Presence of automatic (implantable) cardiac defibrillator: Secondary | ICD-10-CM | POA: Diagnosis not present

## 2017-08-04 DIAGNOSIS — I429 Cardiomyopathy, unspecified: Secondary | ICD-10-CM | POA: Diagnosis not present

## 2017-08-05 ENCOUNTER — Inpatient Hospital Stay (HOSPITAL_BASED_OUTPATIENT_CLINIC_OR_DEPARTMENT_OTHER): Payer: BLUE CROSS/BLUE SHIELD | Admitting: Hematology & Oncology

## 2017-08-05 ENCOUNTER — Encounter: Payer: Self-pay | Admitting: Hematology & Oncology

## 2017-08-05 ENCOUNTER — Other Ambulatory Visit: Payer: Self-pay

## 2017-08-05 ENCOUNTER — Inpatient Hospital Stay: Payer: BLUE CROSS/BLUE SHIELD | Attending: Hematology & Oncology

## 2017-08-05 VITALS — BP 107/85 | HR 72 | Temp 98.0°F | Wt 189.4 lb

## 2017-08-05 DIAGNOSIS — D6851 Activated protein C resistance: Secondary | ICD-10-CM

## 2017-08-05 DIAGNOSIS — I2510 Atherosclerotic heart disease of native coronary artery without angina pectoris: Secondary | ICD-10-CM

## 2017-08-05 LAB — CBC WITH DIFFERENTIAL (CANCER CENTER ONLY)
BASOS PCT: 1 %
Basophils Absolute: 0.1 10*3/uL (ref 0.0–0.1)
EOS ABS: 0.7 10*3/uL — AB (ref 0.0–0.5)
Eosinophils Relative: 9 %
HCT: 47.5 % (ref 38.7–49.9)
Hemoglobin: 16.4 g/dL (ref 13.0–17.1)
Lymphocytes Relative: 23 %
Lymphs Abs: 1.7 10*3/uL (ref 0.9–3.3)
MCH: 30.8 pg (ref 28.0–33.4)
MCHC: 34.5 g/dL (ref 32.0–35.9)
MCV: 89.3 fL (ref 82.0–98.0)
MONO ABS: 0.6 10*3/uL (ref 0.1–0.9)
Monocytes Relative: 8 %
NEUTROS PCT: 59 %
Neutro Abs: 4.5 10*3/uL (ref 1.5–6.5)
Platelet Count: 295 10*3/uL (ref 145–400)
RBC: 5.32 MIL/uL (ref 4.20–5.70)
RDW: 13 % (ref 11.1–15.7)
WBC Count: 7.5 10*3/uL (ref 4.0–10.0)

## 2017-08-05 MED ORDER — TRAZODONE HCL 50 MG PO TABS: 50.0000 mg | ORAL_TABLET | Freq: Every day | ORAL | 4 refills | Status: AC

## 2017-08-05 MED ORDER — FOLIC ACID 1 MG PO TABS: 1.0000 mg | ORAL_TABLET | Freq: Every day | ORAL | 6 refills | Status: AC

## 2017-08-06 LAB — CARDIOLIPIN ANTIBODIES, IGG, IGM, IGA
ANTICARDIOLIPIN IGM: 13 [MPL'U]/mL — AB (ref 0–12)
Anticardiolipin IgA: <9 APL U/mL (ref 0–11)
Anticardiolipin IgG: <9 GPL U/mL (ref 0–14)

## 2017-08-06 LAB — HOMOCYSTEINE: Homocysteine: 25.8 umol/L — ABNORMAL HIGH (ref 0.0–15.0)

## 2017-08-06 LAB — BETA-2-GLYCOPROTEIN I ABS, IGG/M/A
Beta-2 Glyco I IgG: <9 GPI IgG units (ref 0–20)
Beta-2-Glycoprotein I IgM: 19 GPI IgM units (ref 0–32)

## 2017-08-07 LAB — LUPUS ANTICOAGULANT PANEL: PTT LA: 140.1 s — AB (ref 0.0–51.9)

## 2017-08-07 LAB — HEXAGONAL PHASE PHOSPHOLIPID: Hexagonal Phase Phospholipid: 28 s — ABNORMAL HIGH (ref 0–11)

## 2017-08-07 LAB — DRVVT CONFIRM: dRVVT Confirm: >2.2 ratio — ABNORMAL HIGH (ref 0.8–1.2)

## 2017-08-07 LAB — DRVVT MIX: dRVVT Mix: 115.1 s — ABNORMAL HIGH (ref 0.0–47.0)

## 2017-08-07 LAB — PTT-LA MIX: PTT-LA Mix: 121.2 s — ABNORMAL HIGH (ref 0.0–48.9)

## 2017-08-12 ENCOUNTER — Ambulatory Visit: Payer: BLUE CROSS/BLUE SHIELD | Admitting: Pharmacist

## 2017-08-12 VITALS — BP 102/78 | HR 60 | Ht 74.0 in | Wt 188.0 lb

## 2017-08-12 DIAGNOSIS — I5022 Chronic systolic (congestive) heart failure: Secondary | ICD-10-CM | POA: Diagnosis not present

## 2017-08-12 MED ORDER — LEVETIRACETAM ER 750 MG PO TB24: 750.0000 mg | ORAL_TABLET | Freq: Two times a day (BID) | ORAL | 0 refills | Status: AC

## 2017-08-12 MED ORDER — ATORVASTATIN CALCIUM 80 MG PO TABS: 80.0000 mg | ORAL_TABLET | Freq: Every evening | ORAL | 1 refills | Status: DC

## 2017-08-12 MED ORDER — SACUBITRIL-VALSARTAN 24-26 MG PO TABS: 1.0000 | ORAL_TABLET | Freq: Two times a day (BID) | ORAL | 0 refills | Status: DC

## 2017-08-12 NOTE — Progress Notes (Signed)
HPI:  Christin Fudgeimothy Schembri is a 47 y.o. male patient of Dr Royann Shiversroitoru, with a PMH below who presents today for hypertension clinic and possible Entresto start.  He has a complex medical history including a suspected anterior MI (date unknown, stents placed), cardiac arrest,  (04/2016), ICD implantation (with 2 discharges), chronic systolic heart failure (NYHA class II), prior stroke (with mild left hemiparesis, he also mentions earlier stroke at age 47), epilepsy, Factor V Leiden, and hyperlipidemia.    He has just recently moved to the [place] area from [place], so not all of his past medical records are available.  Today he comes in with all his medication bottles.  Metoprolol was resumed during last office visit prior to initiating ENTRESTO. Plan to transition from losartan to ENTRESTO today and repeat BMET in 2 weeks prior to further ENTRESTO titration.    Blood Pressure Goal:  130/80  Current Medications:  HCTZ 12.5 mg daily  Losartan 50 mg daily  Metoprolol succ 50 mg daily   Family Hx:  Father - passed in early 9550's from MI, 4th, first was late 2830's  MGP both died from strokes, grandfather around age 47, grandmother in her 190s  1 sister with FVL  Social Hx:  Prior smoker, no alcohol, maybe 1 Dr. Reino KentPepper per week  Diet:  Mostly home cooked foods, no added salt; avoids processed foods, plenty of veggies fresh in summer, frozen in winter  Exercise:  Yard work, 2-3 days per week, 3 [place]  Home BP readings:  Has older cuff, not sure if reliable  Intolerances:   NKDA  Labs:    06/2017:  Na 140, K 4.1, Glu 83, BUN 8, SCr 1.11  (CrCl 99)  Wt Readings from Last 3 Encounters:  08/12/17 188 lb (85.3 kg)  08/05/17 189 lb 6.4 oz (85.9 kg)  05/26/17 185 lb 9.6 oz (84.2 kg)   BP Readings from Last 3 Encounters:  08/12/17 102/78  08/05/17 107/85  07/03/17 (!) 122/96   Pulse Readings from Last 3 Encounters:  08/12/17 60  08/05/17 72  05/26/17 65    Current Outpatient  Medications  Medication Sig Dispense Refill  . atorvastatin (LIPITOR) 80 MG tablet Take 1 tablet (80 mg total) by mouth every evening. 90 tablet 1  . clopidogrel (PLAVIX) 75 MG tablet Take 1 tablet (75 mg total) by mouth daily. 90 tablet 3  . dabigatran (PRADAXA) 150 MG CAPS capsule Take 1 capsule (150 mg total) by mouth 2 (two) times daily. 180 capsule 1  . ezetimibe (ZETIA) 10 MG tablet Take 1 tablet (10 mg total) by mouth daily. 90 tablet 3  . folic acid (FOLVITE) 1 MG tablet Take 1 tablet (1 mg total) by mouth daily. 90 tablet 6  . Levetiracetam 750 MG TB24 Take 1 tablet (750 mg total) by mouth 2 (two) times daily. 60 tablet 0  . metoprolol succinate (TOPROL-XL) 50 MG 24 hr tablet Take 1 tablet (50 mg total) by mouth daily. Take with or immediately following a meal. 90 tablet 3  . traZODone (DESYREL) 50 MG tablet Take 1 tablet (50 mg total) by mouth at bedtime. 30 tablet 4  . nitroGLYCERIN (NITROSTAT) 0.4 MG SL tablet Place 1 tablet under the tongue as needed. X 3 doses    . sacubitril-valsartan (ENTRESTO) 24-26 MG Take 1 tablet by mouth 2 (two) times daily. To replace losartan 60 tablet 0   No current facility-administered medications for this visit.     No Known  Allergies  Past Medical History:  Diagnosis Date  . Arrhythmia   . Clotting disorder (HCC)    factor 5 Leiden   . Coronary artery disease   . Heart disease   . Hyperlipidemia   . Seizures (HCC)   . Stroke (HCC)     Blood pressure 102/78, pulse 60, height 6\' 2"  (1.88 m), weight 188 lb (85.3 kg).  CHF (congestive heart failure), NYHA class I, chronic, systolic (HCC) Blood pressure and renal function remain appropriate to transition from losartan to ENTRESTO. Will initiate ENTRESTO at 24/26mg  due to BP at lower end of normal. Patient instructed to stop taking HCTZ 12.5mg  to allow BP increase prior to further ENTRESTO titration. HR is also appropriate after initiating metoprolol succinate 50mg  daily during last month visit.   Plan to repeat BMET in 2 weeks and follow up as needed. Patient already schedule for f/u visit with DR Croitoru in 3 weeks.   Susi Goslin Rodriguez-Guzman PharmD, BCPS, CPP [facility] [address] [place] [address] 08/14/2017 9:15 AM

## 2017-08-12 NOTE — Patient Instructions (Addendum)
Return for a follow up appointment in 3 weeks  Check your blood pressure at home daily (if able) and keep record of the readings.  Take your BP meds as follows: *STOP taking losartan* *STOP taking hydrochlorothiazide*  *START taking ENTRESTO 24/26 twice daily* *REPEAT blood work in 2 weeks*  Bring all of your meds, your BP cuff and your record of home blood pressures to your next appointment.  Exercise as you're able, try to walk approximately 30 minutes per day.  Keep salt intake to a minimum, especially watch canned and prepared boxed foods.  Eat more fresh fruits and vegetables and fewer canned items.  Avoid eating in fast food restaurants.    HOW TO TAKE YOUR BLOOD PRESSURE: . Rest 5 minutes before taking your blood pressure. .  Don't smoke or drink caffeinated beverages for at least 30 minutes before. . Take your blood pressure before (not after) you eat. . Sit comfortably with your back supported and both feet on the floor (don't cross your legs). . Elevate your arm to heart level on a table or a desk. . Use the proper sized cuff. It should fit smoothly and snugly around your bare upper arm. There should be enough room to slip a fingertip under the cuff. The bottom edge of the cuff should be 1 inch above the crease of the elbow. . Ideally, take 3 measurements at one sitting and record the average.

## 2017-08-14 ENCOUNTER — Encounter: Payer: Self-pay | Admitting: Pharmacist

## 2017-08-14 ENCOUNTER — Other Ambulatory Visit: Payer: Self-pay

## 2017-08-14 ENCOUNTER — Encounter (HOSPITAL_BASED_OUTPATIENT_CLINIC_OR_DEPARTMENT_OTHER): Payer: Self-pay

## 2017-08-14 ENCOUNTER — Emergency Department (HOSPITAL_BASED_OUTPATIENT_CLINIC_OR_DEPARTMENT_OTHER)
Admission: EM | Admit: 2017-08-14 | Discharge: 2017-08-14 | Disposition: A | Discharge status: Home / Self Care | Payer: BLUE CROSS/BLUE SHIELD | Attending: Emergency Medicine | Admitting: Emergency Medicine

## 2017-08-14 DIAGNOSIS — I10 Essential (primary) hypertension: Secondary | ICD-10-CM | POA: Diagnosis not present

## 2017-08-14 DIAGNOSIS — I252 Old myocardial infarction: Secondary | ICD-10-CM | POA: Insufficient documentation

## 2017-08-14 DIAGNOSIS — R197 Diarrhea, unspecified: Secondary | ICD-10-CM | POA: Insufficient documentation

## 2017-08-14 DIAGNOSIS — E785 Hyperlipidemia, unspecified: Secondary | ICD-10-CM | POA: Insufficient documentation

## 2017-08-14 DIAGNOSIS — Z8673 Personal history of transient ischemic attack (TIA), and cerebral infarction without residual deficits: Secondary | ICD-10-CM | POA: Diagnosis not present

## 2017-08-14 HISTORY — DX: Irritable bowel syndrome without diarrhea: K58.9

## 2017-08-14 HISTORY — DX: Cardiac arrest, cause unspecified: I46.9

## 2017-08-14 HISTORY — DX: Acute myocardial infarction, unspecified: I21.9

## 2017-08-14 HISTORY — DX: Ulcerative colitis, unspecified, without complications: K51.90

## 2017-08-14 HISTORY — DX: Essential (primary) hypertension: I10

## 2017-08-14 HISTORY — DX: Epilepsy, unspecified, not intractable, without status epilepticus: G40.909

## 2017-08-14 HISTORY — DX: Irritable bowel syndrome, unspecified: K58.9

## 2017-08-14 HISTORY — DX: Cerebral infarction, unspecified: I63.9

## 2017-08-14 LAB — CBC WITH DIFFERENTIAL/PLATELET
BASOS ABS: 0 10*3/uL (ref 0.0–0.1)
Basophils Relative: 0 %
EOS ABS: 0.7 10*3/uL (ref 0.0–0.7)
Eosinophils Relative: 7 %
HCT: 45.7 % (ref 39.0–52.0)
HEMOGLOBIN: 15.8 g/dL (ref 13.0–17.0)
LYMPHS ABS: 1.4 10*3/uL (ref 0.7–4.0)
Lymphocytes Relative: 15 %
MCH: 30.8 pg (ref 26.0–34.0)
MCHC: 34.6 g/dL (ref 30.0–36.0)
MCV: 89.1 fL (ref 78.0–100.0)
Monocytes Absolute: 0.8 10*3/uL (ref 0.1–1.0)
Monocytes Relative: 9 %
NEUTROS PCT: 69 %
Neutro Abs: 6.2 10*3/uL (ref 1.7–7.7)
PLATELETS: 285 10*3/uL (ref 150–400)
RBC: 5.13 MIL/uL (ref 4.22–5.81)
RDW: 13 % (ref 11.5–15.5)
WBC: 9.1 10*3/uL (ref 4.0–10.5)

## 2017-08-14 LAB — COMPREHENSIVE METABOLIC PANEL
ALT: 34 U/L (ref 0–44)
AST: 26 U/L (ref 15–41)
Albumin: 4.3 g/dL (ref 3.5–5.0)
Alkaline Phosphatase: 98 U/L (ref 38–126)
Anion gap: 9 (ref 5–15)
BILIRUBIN TOTAL: 1 mg/dL (ref 0.3–1.2)
BUN: 19 mg/dL (ref 6–20)
CHLORIDE: 101 mmol/L (ref 98–111)
CO2: 27 mmol/L (ref 22–32)
CREATININE: 1.45 mg/dL — AB (ref 0.61–1.24)
Calcium: 8.9 mg/dL (ref 8.9–10.3)
GFR calc non Af Amer: 56 mL/min — ABNORMAL LOW (ref >60)
Glucose, Bld: 95 mg/dL (ref 70–99)
POTASSIUM: 3.8 mmol/L (ref 3.5–5.1)
Sodium: 137 mmol/L (ref 135–145)
TOTAL PROTEIN: 7.4 g/dL (ref 6.5–8.1)

## 2017-08-14 LAB — URINALYSIS, ROUTINE W REFLEX MICROSCOPIC
Bilirubin Urine: NEGATIVE
Glucose, UA: NEGATIVE mg/dL
Hgb urine dipstick: NEGATIVE
Ketones, ur: NEGATIVE mg/dL
LEUKOCYTES UA: NEGATIVE
NITRITE: NEGATIVE
PROTEIN: NEGATIVE mg/dL
pH: 6 (ref 5.0–8.0)

## 2017-08-14 LAB — LIPASE, BLOOD: LIPASE: 44 U/L (ref 11–51)

## 2017-08-14 MED ORDER — LOPERAMIDE HCL 2 MG PO CAPS: 2.0000 mg | ORAL_CAPSULE | Freq: Four times a day (QID) | ORAL | 0 refills | Status: AC | PRN

## 2017-08-14 MED ORDER — SODIUM CHLORIDE 0.9 % IV BOLUS
500.0000 mL | Freq: Once | INTRAVENOUS | Status: AC
  Administered 2017-08-14: 500 mL via INTRAVENOUS

## 2017-08-14 MED FILL — SM ANTI-DIARRHEAL 2 MG CAPL: 2 | 12 days supply | Qty: 48 | Fill #0

## 2017-08-14 NOTE — ED Triage Notes (Signed)
Pt states he has had watery diarrhea for 2-3 days. Denies recent abx use.

## 2017-08-14 NOTE — Discharge Instructions (Addendum)
Return to ED for worsening symptoms, severe abdominal pain, increased vomiting, vomiting up blood, chest pain. Begin taking the Imodium if your symptoms do not improve in 2 days.

## 2017-08-14 NOTE — Assessment & Plan Note (Signed)
Blood pressure and renal function remain appropriate to transition from losartan to ENTRESTO. Will initiate ENTRESTO at 24/26mg  due to BP at lower end of normal. Patient instructed to stop taking HCTZ 12.5mg  to allow BP increase prior to further ENTRESTO titration. HR is also appropriate after initiating metoprolol succinate 50mg  daily during last month visit.  Plan to repeat BMET in 2 weeks and follow up as needed. Patient already schedule for f/u visit with DR Croitoru in 3 weeks.

## 2017-08-14 NOTE — ED Provider Notes (Signed)
[facility] EMERGENCY DEPARTMENT Provider Note   CSN: [id number] Arrival date & time: 08/14/17  1303     History   Chief Complaint Chief Complaint  Patient presents with  . Diarrhea    HPI Roy Lozano is a 47 y.o. male with a past medical history of prior MI, prior CVA, hypertension, CHF, ulcerative colitis who presents to ED for evaluation of 2-1/2-day history of several episodes of watery diarrhea.  He reports having a bowel movement every 30 minutes to 1 hour.  Symptoms began after eating several meals at a fast food restaurant including barbecue, [employer] and [employer].  He has not tried any medications to help with the symptoms.  He did have one episode of nausea and nonbloody, nonbilious emesis last week.  He denies any abdominal pain.  He did have some blood in his stool while wiping due to the frequent bowel movements.  Denies any pain with defecation.  Denies any fevers, recent travel, recent antibiotic use, urinary symptoms.   HPI  Past Medical History:  Diagnosis Date  . AMI (acute myocardial infarction) (HCC)   . Cardiac arrest (HCC)   . CVA (cerebral vascular accident) (HCC)   . Epilepsy (HCC)   . Hyperlipidemia   . Hypertension   . IBS (irritable bowel syndrome)   . Ulcerative colitis (HCC)     There are no active problems to display for this patient.   Past Surgical History:  Procedure Laterality Date  . CARDIAC CATHETERIZATION    . CARDIAC DEFIBRILLATOR PLACEMENT          Home Medications    Prior to Admission medications   Medication Sig Start Date End Date Taking? Authorizing Provider  loperamide (IMODIUM) 2 MG capsule Take 1 capsule (2 mg total) by mouth 4 (four) times daily as needed for diarrhea or loose stools. 08/14/17   Dietrich Pates, PA-C    Family History No family history on file.  Social History Social History   Tobacco Use  . Smoking status: Not on file  Substance Use Topics  . Alcohol use: Not on file  . Drug use:  Not on file     Allergies   Patient has no known allergies.   Review of Systems Review of Systems  Constitutional: Negative for appetite change, chills and fever.  HENT: Negative for ear pain, rhinorrhea, sneezing and sore throat.   Eyes: Negative for photophobia and visual disturbance.  Respiratory: Negative for cough, chest tightness, shortness of breath and wheezing.   Cardiovascular: Negative for chest pain and palpitations.  Gastrointestinal: Positive for diarrhea, nausea and vomiting. Negative for abdominal pain, blood in stool and constipation.  Genitourinary: Negative for dysuria, hematuria and urgency.  Musculoskeletal: Negative for myalgias.  Skin: Negative for rash.  Neurological: Negative for dizziness, weakness and light-headedness.     Physical Exam Updated Vital Signs BP 119/73   Pulse 60   Temp 98.3 F (36.8 C) (Oral)   Resp 17   Ht 6\' 2"  (1.88 m)   Wt 85.7 kg   SpO2 98%   BMI 24.27 kg/m   Physical Exam  Constitutional: He appears well-developed and well-nourished. No distress.  HENT:  Head: Normocephalic and atraumatic.  Nose: Nose normal.  Eyes: Pupils are equal, round, and reactive to light. Conjunctivae and EOM are normal. Right eye exhibits no discharge. Left eye exhibits no discharge. No scleral icterus.  Neck: Normal range of motion. Neck supple.  Cardiovascular: Normal rate, regular rhythm, normal heart sounds and  intact distal pulses. Exam reveals no gallop and no friction rub.  No murmur heard. Pulmonary/Chest: Effort normal and breath sounds normal. No respiratory distress.  Abdominal: Soft. Bowel sounds are normal. He exhibits no distension. There is no tenderness. There is no guarding.  No abdominal tenderness to palpation.  Musculoskeletal: Normal range of motion. He exhibits no edema.  Neurological: He is alert. He exhibits normal muscle tone. Coordination normal.  Skin: Skin is warm and dry. No rash noted.  Psychiatric: He has a  normal mood and affect.  Nursing note and vitals reviewed.    ED Treatments / Results  Labs (all labs ordered are listed, but only abnormal results are displayed) Labs Reviewed  COMPREHENSIVE METABOLIC PANEL - Abnormal; Notable for the following components:      Result Value   Creatinine, Ser 1.45 (*)    GFR calc non Af Amer 56 (*)    All other components within normal limits  URINALYSIS, ROUTINE W REFLEX MICROSCOPIC - Abnormal; Notable for the following components:   Specific Gravity, Urine >1.030 (*)    All other components within normal limits  GASTROINTESTINAL PANEL BY PCR, STOOL (REPLACES STOOL CULTURE)  CBC WITH DIFFERENTIAL/PLATELET  LIPASE, BLOOD    EKG None  Radiology No results found.  Procedures Procedures (including critical care time)  Medications Ordered in ED Medications  sodium chloride 0.9 % bolus 500 mL (0 mLs Intravenous Stopped 08/14/17 1656)     Initial Impression / Assessment and Plan / ED Course  I have reviewed the triage vital signs and the nursing notes.  Pertinent labs & imaging results that were available during my care of the patient were reviewed by me and considered in my medical decision making (see chart for details).  Clinical Course as of Aug 14 1721  Thu Aug 14, 2017  1600 Chart review shows EF of 35 to 40% on echo done this year.  Patient with pressures in 90s systolic.  He does not appear fluid overloaded.  Will give 500 cc bolus and reassess.   [HK]  1600 Creatinine(!): 1.45 [HK]  1619 BP improved to 107/78.  We will continue to monitor and complete fluid bolus. Patient still unable to produce urine sample or stool sample.   [HK]  1653 BP improved to 119/80.   [HK]  1720 BP: 119/73 [HK]    Clinical Course User Index [HK] Dietrich PatesKhatri, Audwin Semper, PA-C    47 year old male with past EF 35 to 40%, CAD, prior CVA, presents to ED for evaluation of 2-1/2-day history of watery diarrhea.  Denies any abdominal pain.  Unsure if this is related  to several fast food meals that he ate in the past week.  No sick contacts with similar symptoms.  On physical exam he appears overall well.  He does not appear dehydrated or fluid overloaded.  No abdominal tenderness to palpation.  Blood pressure is initially in 90s systolic.  Lab work shows creatinine of 1.45 which is increased from June 2019 showing 1.1.  He is afebrile.  Lab work including CBC, lipase, urinalysis unremarkable.  Patient unable to provide stool sample here. Suspect that symptoms are viral in nature.  Patient given 500 cc bolus with improvement in blood pressure to 119/73.  He remains in NAD.  He is able to produce urine successfully.  Will advise him to follow-up with PCP and to return to ED for any severe worsening symptoms.  Will give Imodium if symptoms do not improve.  Portions of this note were  generated with Scientist, clinical (histocompatibility and immunogenetics). Dictation errors may occur despite best attempts at proofreading.   Final Clinical Impressions(s) / ED Diagnoses   Final diagnoses:  Diarrhea, unspecified type    ED Discharge Orders         Ordered    loperamide (IMODIUM) 2 MG capsule  4 times daily PRN     08/14/17 1719           Dietrich Pates, PA-C 08/14/17 1723    Tegeler, Canary Brim, MD 08/14/17 [phone]

## 2017-08-15 ENCOUNTER — Encounter: Payer: Self-pay | Admitting: Pharmacist

## 2017-08-20 DIAGNOSIS — G40209 Localization-related (focal) (partial) symptomatic epilepsy and epileptic syndromes with complex partial seizures, not intractable, without status epilepticus: Secondary | ICD-10-CM | POA: Diagnosis not present

## 2017-08-20 DIAGNOSIS — I6930 Unspecified sequelae of cerebral infarction: Secondary | ICD-10-CM | POA: Diagnosis not present

## 2017-08-20 DIAGNOSIS — G4700 Insomnia, unspecified: Secondary | ICD-10-CM | POA: Diagnosis not present

## 2017-08-28 DIAGNOSIS — I5022 Chronic systolic (congestive) heart failure: Secondary | ICD-10-CM | POA: Diagnosis not present

## 2017-08-29 LAB — COMPREHENSIVE METABOLIC PANEL
A/G RATIO: 2 (ref 1.2–2.2)
ALT: 30 IU/L (ref 0–44)
AST: 26 IU/L (ref 0–40)
Albumin: 4.5 g/dL (ref 3.5–5.5)
Alkaline Phosphatase: 112 IU/L (ref 39–117)
BILIRUBIN TOTAL: 0.8 mg/dL (ref 0.0–1.2)
BUN/Creatinine Ratio: 5 — ABNORMAL LOW (ref 9–20)
BUN: 7 mg/dL (ref 6–24)
CALCIUM: 9.8 mg/dL (ref 8.7–10.2)
CHLORIDE: 103 mmol/L (ref 96–106)
CO2: 24 mmol/L (ref 20–29)
Creatinine, Ser: 1.29 mg/dL — ABNORMAL HIGH (ref 0.76–1.27)
GFR calc Af Amer: 76 mL/min/{1.73_m2} (ref >59)
GFR calc non Af Amer: 66 mL/min/{1.73_m2} (ref >59)
GLUCOSE: 88 mg/dL (ref 65–99)
Globulin, Total: 2.3 g/dL (ref 1.5–4.5)
POTASSIUM: 4.6 mmol/L (ref 3.5–5.2)
Sodium: 143 mmol/L (ref 134–144)
Total Protein: 6.8 g/dL (ref 6.0–8.5)

## 2017-09-02 ENCOUNTER — Ambulatory Visit: Payer: BLUE CROSS/BLUE SHIELD | Admitting: Cardiovascular Disease

## 2017-09-02 ENCOUNTER — Encounter: Payer: Self-pay | Admitting: Cardiovascular Disease

## 2017-09-02 VITALS — BP 102/78 | HR 64 | Ht 74.0 in | Wt 187.6 lb

## 2017-09-02 DIAGNOSIS — E785 Hyperlipidemia, unspecified: Secondary | ICD-10-CM

## 2017-09-02 DIAGNOSIS — D6851 Activated protein C resistance: Secondary | ICD-10-CM

## 2017-09-02 DIAGNOSIS — I5042 Chronic combined systolic (congestive) and diastolic (congestive) heart failure: Secondary | ICD-10-CM | POA: Diagnosis not present

## 2017-09-02 DIAGNOSIS — I2510 Atherosclerotic heart disease of native coronary artery without angina pectoris: Secondary | ICD-10-CM

## 2017-09-02 DIAGNOSIS — I69354 Hemiplegia and hemiparesis following cerebral infarction affecting left non-dominant side: Secondary | ICD-10-CM

## 2017-09-02 DIAGNOSIS — I4901 Ventricular fibrillation: Secondary | ICD-10-CM

## 2017-09-02 DIAGNOSIS — Z95810 Presence of automatic (implantable) cardiac defibrillator: Secondary | ICD-10-CM | POA: Diagnosis not present

## 2017-09-02 DIAGNOSIS — Z7901 Long term (current) use of anticoagulants: Secondary | ICD-10-CM

## 2017-09-02 NOTE — Patient Instructions (Signed)
Dr Royann Shiversroitoru recommends that you continue on your current medications as directed. Please refer to the Current Medication list given to you today.  Remote monitoring is used to monitor your Pacemaker or ICD from home. This monitoring reduces the number of office visits required to check your device to one time per year. It allows us to keep an eye on the functioning of your device to ensure it is working properly. You are scheduled for a device check from home on Tuesday, November 26th, 2019. You may send your transmission at any time that day. If you have a wireless device, the transmission will be sent automatically. After your physician reviews your transmission, you will receive a notification with your next transmission date.  To improve our patient care and to more adequately follow your device, [facility] has decided, as a practice, to start following each patient four times a year with your home monitor. This means that you may experience a remote appointment that is close to an in-office appointment with your physician. Your insurance will apply at the same rate as other remote monitoring transmissions.  Dr Royann Shiversroitoru recommends that you schedule a follow-up appointment in 12 months with an ICD check. You will receive a reminder letter in the mail two months in advance. If you don't receive a letter, please call our office to schedule the follow-up appointment.  If you need a refill on your cardiac medications before your next appointment, please call your pharmacy.

## 2017-09-02 NOTE — Progress Notes (Signed)
Cardiology Office Note:    Date:  09/02/2017   ID:  Roy Lozano, DOB Oct 22, 1970, MRN [id number]  PCP:  Patient, No Pcp Per  Cardiologist:  Owais Pruett  Referring MD: No ref. provider found   Chief Complaint  Patient presents with  . Follow-up    pt denied chest pain    History of Present Illness:    Roy Lozano is a 47 y.o. male with a hx of previous myocardial infarction (suspect anterior STEMI), history of cardiac arrest in April 2018, followed by implantation of a single-chamber defibrillator with 2 subsequent appropriate defibrillator discharges for ventricular fibrillation, most recently in January 2019.  (while living in [place]).    Since his last visit his hydrochlorothiazide was discontinued and he started [place].  He has tolerated transition without any dizziness, syncope, leg edema or shortness of breath.  Interrogation of his defibrillator does show a transient reduction in thoracic impedance after discontinuation of the diuretic but the parameters are quickly returning to baseline.  His blood pressure is quite low at 102/78, but he has not had symptoms of hypotension.  He denies any angina pectoris.    He reports that he "walks a lot" but at most his iPhone shows about 3000 steps a day and his activity level is no more than 1.5 hours/day.  The fact over the last month activity level has decreased.  His device is a Medtronic Visia AF MRI VR device.  It was implanted on April 16, 2016, estimated longevity another 10.7 years.  All lead parameters are excellent.  There is no ventricular pacing.  The device is programmed as a backup VVI 40 bpm shock box.  There have been no episodes of treated arrhythmia and he has not had any nonsustained ventricular tachycardia since his last appointment  Interrogation of the device shows that he has delivered 2 shocks.  1 of them was last November and the details have been deleted from the device memory.  The other one occurred on  January 29 at 0351 hrs.  He had abrupt onset of what appears to be polymorphic VT/VF with a cycle length varying between 130 and 190 ms and appropriate single defibrillator discharge at 36 J to convert the rhythm to ventricular pacing.  He was unaware of the discharge which occurred in the middle of the night.  He was warned of its occurrence due to the device alarms.  Unfortunately, Roy Lozano also has some mild-moderate memory issues since his cardiac arrest and has not always available to provide details about his medical problems.  He is clearly a very intelligent and educated gentleman, he is here alone today.  He reports that his cardiac arrest occurred during hospitalization for pneumonia in April 2018.   It is not clear about the sequence of events but he had a large heart attack of the front wall of his heart.  He received stents.  He has also had a stroke which has left him with mild left-sided hemiparesis.  He states that his left hand is always cold.  He has limited dexterity and ability to grasp without limb and is applying for disability based on this.  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  He is now living with his 45 year old mother.  In addition to his cardiac problem has a history of epilepsy and factor V Leiden deficiency.  He reports having had a previous stroke at age 49.  His seizure disorder took a long time to diagnose accurately and treat, but is  currently well controlled with antiepileptic medications.  He has a history of hypertension and hyperlipidemia.  He smoked very briefly and in very small amounts: roughly a pack every 2 weeks for 1-1/2 years, quit 2018.  His list of medications includes clopidogrel and Pradaxa as well as a beta-blocker and low dose angiotensin receptor blocker.  He has been prescribed high-dose atorvastatin.  Past Medical History:  Diagnosis Date  . AMI (acute myocardial infarction) ([place])   . Arrhythmia   . Cardiac arrest (Roy Lozano)   . Clotting disorder (Roy Lozano)     factor 5 Leiden   . Coronary artery disease   . CVA (cerebral vascular accident) (Roy Lozano)   . Epilepsy (Roy Lozano)   . Heart disease   . Hyperlipidemia   . Hypertension   . IBS (irritable bowel syndrome)   . Seizures (Roy Lozano)   . Stroke (Roy Lozano)   . Ulcerative colitis [facility])     Past Surgical History:  Procedure Laterality Date  . CARDIAC CATHETERIZATION    . CARDIAC DEFIBRILLATOR PLACEMENT    . ICD IMPLANT Left 04/16/2016   Medtronic    Current Medications:  Current Outpatient Medications on File Prior to Visit  Medication Sig Dispense Refill  . atorvastatin (LIPITOR) 80 MG tablet Take 1 tablet (80 mg total) by mouth every evening. 90 tablet 1  . clopidogrel (PLAVIX) 75 MG tablet Take 1 tablet (75 mg total) by mouth daily. 90 tablet 3  . dabigatran (PRADAXA) 150 MG CAPS capsule Take 1 capsule (150 mg total) by mouth 2 (two) times daily. 180 capsule 1  . ezetimibe (ZETIA) 10 MG tablet Take 1 tablet (10 mg total) by mouth daily. 90 tablet 3  . folic acid (FOLVITE) 1 MG tablet Take 1 tablet (1 mg total) by mouth daily. 90 tablet 6  . Levetiracetam 750 MG TB24 Take 1 tablet (750 mg total) by mouth 2 (two) times daily. 60 tablet 0  . loperamide (IMODIUM) 2 MG capsule Take 1 capsule (2 mg total) by mouth 4 (four) times daily as needed for diarrhea or loose stools. 6 capsule 0  . metoprolol succinate (TOPROL-XL) 50 MG 24 hr tablet Take 1 tablet (50 mg total) by mouth daily. Take with or immediately following a meal. 90 tablet 3  . nitroGLYCERIN (NITROSTAT) 0.4 MG SL tablet Place 1 tablet under the tongue as needed. X 3 doses    . sacubitril-valsartan (ENTRESTO) 24-26 MG Take 1 tablet by mouth 2 (two) times daily. To replace losartan 60 tablet 0  . traZODone (DESYREL) 50 MG tablet Take 1 tablet (50 mg total) by mouth at bedtime. 30 tablet 4   No current facility-administered medications on file prior to visit.       Allergies:   Patient has no known allergies.   Social History    Socioeconomic History  . Marital status: Single    Spouse name: Not on file  . Number of children: Not on file  . Years of education: Not on file  . Highest education level: Not on file  Occupational History  . Not on file  Social Needs  . Financial resource strain: Not on file  . Food insecurity:    Worry: Not on file    Inability: Not on file  . Transportation needs:    Medical: Not on file    Non-medical: Not on file  Tobacco Use  . Smoking status: Former Smoker    Types: Cigarettes    Start date: 03/03/2016  . Smokeless tobacco: Never  Used  Substance and Sexual Activity  . Alcohol use: Yes    Comment: rarely  . Drug use: No  . Sexual activity: Not on file  Lifestyle  . Physical activity:    Days per week: Not on file    Minutes per session: Not on file  . Stress: Not on file  Relationships  . Social connections:    Talks on phone: Not on file    Gets together: Not on file    Attends religious service: Not on file    Active member of club or organization: Not on file    Attends meetings of clubs or organizations: Not on file    Relationship status: Not on file  Other Topics Concern  . Not on file  Social History Narrative   ** Merged History Encounter **         Family History: The patient's family history includes Heart attack in his father; Heart disease in his paternal grandfather and paternal grandmother; Stroke in his maternal grandfather and maternal grandmother.  ROS:   Please see the history of present illness.  All other systems are reviewed and are negative  EKGs/Labs/Other Studies Reviewed:    The following studies were reviewed today: ICD check  EKG:  EKG isordered today.  Shows normal sinus rhythm and nonspecific T wave changes.  Previously noted anterior wall ST-T changes are much less apparent today  Recent Labs: 08/14/2017: Hemoglobin 15.8; Platelets 285 08/28/2017: ALT 30; BUN 7; Creatinine, Ser 1.29; Potassium 4.6; Sodium 143  Recent  Lipid Panel    Component Value Date/Time   CHOL 118 07/03/2017 0904   TRIG 71 07/03/2017 0904   HDL 34 (L) 07/03/2017 0904   CHOLHDL 3.5 07/03/2017 0904   LDLCALC 70 07/03/2017 0904    Physical Exam:    VS:  BP 102/78   Pulse 64   Ht 6' 2"  (1.88 m)   Wt 187 lb 9.6 oz (85.1 kg)   BMI 24.09 kg/m     Wt Readings from Last 3 Encounters:  09/02/17 187 lb 9.6 oz (85.1 kg)  08/14/17 189 lb (85.7 kg)  08/12/17 188 lb (85.3 kg)    General: Alert, oriented x3, no distress, he appears very lean Head: no evidence of trauma, PERRL, EOMI, no exophtalmos or lid lag, no myxedema, no xanthelasma; normal ears, nose and oropharynx Neck: normal jugular venous pulsations and no hepatojugular reflux; brisk carotid pulses without delay and no carotid bruits Chest: clear to auscultation, no signs of consolidation by percussion or palpation, normal fremitus, symmetrical and full respiratory excursions Cardiovascular: normal position and quality of the apical impulse, regular rhythm, normal first and second heart sounds, no murmurs, rubs or gallops Abdomen: no tenderness or distention, no masses by palpation, no abnormal pulsatility or arterial bruits, normal bowel sounds, no hepatosplenomegaly Extremities: no clubbing, cyanosis or edema; 2+ radial, ulnar and brachial pulses bilaterally; 2+ right femoral, posterior tibial and dorsalis pedis pulses; 2+ left femoral, posterior tibial and dorsalis pedis pulses; no subclavian or femoral bruits Psych: Normal mood and affect Neurological: Alert and oriented x 3, mild 4/5 weakness in the left upper extremity, mild loss of dexterity in the left hand slight ataxia.  Subtle difficulty with memory is apparent.  Otherwise nonfocal neurological exam   ASSESSMENT:    1. VF (ventricular fibrillation) ([place])   2. Coronary artery disease involving native coronary artery of native heart without angina pectoris   3. Chronic combined systolic and diastolic heart failure  ([place])  4. Factor V Leiden ([place])   5. Long term current use of anticoagulant   6. Hemiparesis affecting left side as late effect of stroke ([place])   7. Dyslipidemia (high LDL; low HDL)   8. ICD (implantable cardioverter-defibrillator) in place    PLAN:    In order of problems listed above:  1. VF: No recurrence of arrhythmia since his last device check.  He has not even had nonsustained VT.  He is on a moderate dose of beta-blocker, but his blood pressure probably not allow additional titration  2. CAD: He does not have angina pectoris.  He is on antiplatelet and statin therapy as well as beta-blockers.  We still do not have the report of his catheterization 2018, when I suspect that he received a stent.  More than a year has passed since his acute MI and stent implantation.  Unfortunately still on have all the details of his anatomy.  We will continue dual antiplatelet therapy unless her bleeding complications. The wall motion abnormalities on echo were consistent with infarction in the territory of a large "wraparound" LAD artery 3. CHF: Clinically euvolemic without any loop diuretics.  After stopping his thiazide there was a brief drop in his thoracic impedance but this is quickly returning to baseline.  NYHA functional class II.  EF was 35-40% by echo performed in our facility in May, presumably an improvement from previous assessments.  I do not think his blood pressure will allow further titration of Entresto or addition of spironolactone. 4. Factor V Leiden: Evaluated by Dr. Marin Olp.  He is in agreement with the choice of oral anticoagulant and feels that his procoagulant condition has been important contributor to his stroke at age 89. 5. Pradaxa: Well-tolerated, no bleeding 6. L hemiparesis: Loss of dexterity impairs his ability to use devices such as keyboard and computer mouse. Together with his memory impairment, this will make it very challenging to have a job. 7. HLP: Draw lipid profile  today.  On high-dose statin and ezetimibe his recently checked LDL is at target 70. 8. ICD: Normal device function.  Has had 2 appropriate shocks for ventricular fibrillation.  On beta-blockers.  Would like to refer to EP, but without additional data from his previous work-up it is unlikely that his management would be changed at this point.  Medication Adjustments/Labs and Tests Ordered: Current medicines are reviewed at length with the patient today.  Concerns regarding medicines are outlined above.  No orders of the defined types were placed in this encounter.  No orders of the defined types were placed in this encounter.   Signed, Sanda Klein, MD  09/02/2017 4:41 PM    [place]

## 2017-09-10 ENCOUNTER — Other Ambulatory Visit: Payer: Self-pay | Admitting: Cardiovascular Disease

## 2017-11-03 DIAGNOSIS — Z95810 Presence of automatic (implantable) cardiac defibrillator: Secondary | ICD-10-CM | POA: Diagnosis not present

## 2017-11-03 DIAGNOSIS — I429 Cardiomyopathy, unspecified: Secondary | ICD-10-CM | POA: Diagnosis not present

## 2017-12-02 ENCOUNTER — Ambulatory Visit (INDEPENDENT_AMBULATORY_CARE_PROVIDER_SITE_OTHER): Payer: BLUE CROSS/BLUE SHIELD

## 2017-12-02 DIAGNOSIS — I5042 Chronic combined systolic (congestive) and diastolic (congestive) heart failure: Secondary | ICD-10-CM | POA: Diagnosis not present

## 2017-12-02 DIAGNOSIS — I4901 Ventricular fibrillation: Secondary | ICD-10-CM

## 2017-12-02 NOTE — Progress Notes (Signed)
Remote ICD transmission.   

## 2017-12-08 ENCOUNTER — Other Ambulatory Visit: Payer: Self-pay | Admitting: Cardiovascular Disease

## 2018-01-23 LAB — CUP PACEART REMOTE DEVICE CHECK
Battery Voltage: 3.01 V
Brady Statistic RV Percent Paced: 0.03 %
HIGH POWER IMPEDANCE MEASURED VALUE: 71 Ohm
Lead Channel Impedance Value: 475 Ohm
Lead Channel Sensing Intrinsic Amplitude: 21.125 mV
Lead Channel Sensing Intrinsic Amplitude: 21.125 mV
Lead Channel Setting Pacing Amplitude: 1.75 V
Lead Channel Setting Pacing Pulse Width: 0.4 ms
MDC IDC MSMT BATTERY REMAINING LONGEVITY: 128 mo
MDC IDC MSMT LEADCHNL RV IMPEDANCE VALUE: 399 Ohm
MDC IDC MSMT LEADCHNL RV PACING THRESHOLD AMPLITUDE: 0.875 V
MDC IDC MSMT LEADCHNL RV PACING THRESHOLD PULSEWIDTH: 0.4 ms
MDC IDC PG IMPLANT DT: 20180410
MDC IDC SESS DTM: 20191126161800
MDC IDC SET LEADCHNL RV SENSING SENSITIVITY: 0.3 mV

## 2018-02-03 ENCOUNTER — Other Ambulatory Visit: Payer: BLUE CROSS/BLUE SHIELD

## 2018-02-03 ENCOUNTER — Ambulatory Visit: Payer: BLUE CROSS/BLUE SHIELD | Admitting: Hematology & Oncology

## 2018-02-04 ENCOUNTER — Inpatient Hospital Stay: Payer: BLUE CROSS/BLUE SHIELD | Attending: Hematology & Oncology

## 2018-02-04 ENCOUNTER — Inpatient Hospital Stay (HOSPITAL_BASED_OUTPATIENT_CLINIC_OR_DEPARTMENT_OTHER): Payer: BLUE CROSS/BLUE SHIELD | Admitting: Hematology & Oncology

## 2018-02-04 VITALS — BP 125/76 | HR 65 | Temp 98.0°F | Resp 16 | Wt 187.5 lb

## 2018-02-04 DIAGNOSIS — D6851 Activated protein C resistance: Secondary | ICD-10-CM | POA: Insufficient documentation

## 2018-02-04 DIAGNOSIS — Z8673 Personal history of transient ischemic attack (TIA), and cerebral infarction without residual deficits: Secondary | ICD-10-CM | POA: Diagnosis not present

## 2018-02-04 DIAGNOSIS — Z79899 Other long term (current) drug therapy: Secondary | ICD-10-CM

## 2018-02-04 DIAGNOSIS — Z95810 Presence of automatic (implantable) cardiac defibrillator: Secondary | ICD-10-CM

## 2018-02-04 DIAGNOSIS — Z7902 Long term (current) use of antithrombotics/antiplatelets: Secondary | ICD-10-CM

## 2018-02-04 DIAGNOSIS — Z7901 Long term (current) use of anticoagulants: Secondary | ICD-10-CM | POA: Insufficient documentation

## 2018-02-04 DIAGNOSIS — E7211 Homocystinuria: Secondary | ICD-10-CM

## 2018-02-04 LAB — CBC WITH DIFFERENTIAL (CANCER CENTER ONLY)
ABS IMMATURE GRANULOCYTES: 0.05 10*3/uL (ref 0.00–0.07)
BASOS ABS: 0.1 10*3/uL (ref 0.0–0.1)
Basophils Relative: 1 %
Eosinophils Absolute: 0.9 10*3/uL — ABNORMAL HIGH (ref 0.0–0.5)
Eosinophils Relative: 10 %
HEMATOCRIT: 46.5 % (ref 39.0–52.0)
HEMOGLOBIN: 15.1 g/dL (ref 13.0–17.0)
Immature Granulocytes: 1 %
LYMPHS ABS: 1.7 10*3/uL (ref 0.7–4.0)
LYMPHS PCT: 18 %
MCH: 29.2 pg (ref 26.0–34.0)
MCHC: 32.5 g/dL (ref 30.0–36.0)
MCV: 89.8 fL (ref 80.0–100.0)
Monocytes Absolute: 0.6 10*3/uL (ref 0.1–1.0)
Monocytes Relative: 7 %
NEUTROS ABS: 6 10*3/uL (ref 1.7–7.7)
NRBC: 0 % (ref 0.0–0.2)
Neutrophils Relative %: 63 %
Platelet Count: 257 10*3/uL (ref 150–400)
RBC: 5.18 MIL/uL (ref 4.22–5.81)
RDW: 12.9 % (ref 11.5–15.5)
WBC: 9.3 10*3/uL (ref 4.0–10.5)

## 2018-02-04 LAB — CMP (CANCER CENTER ONLY)
ALBUMIN: 4.4 g/dL (ref 3.5–5.0)
ALK PHOS: 92 U/L (ref 38–126)
ALT: 23 U/L (ref 0–44)
AST: 19 U/L (ref 15–41)
Anion gap: 6 (ref 5–15)
BILIRUBIN TOTAL: 0.7 mg/dL (ref 0.3–1.2)
BUN: 7 mg/dL (ref 6–20)
CALCIUM: 9.8 mg/dL (ref 8.9–10.3)
CO2: 30 mmol/L (ref 22–32)
CREATININE: 1.09 mg/dL (ref 0.61–1.24)
Chloride: 107 mmol/L (ref 98–111)
GFR, Est AFR Am: >60 mL/min (ref >60)
GFR, Estimated: >60 mL/min (ref >60)
GLUCOSE: 93 mg/dL (ref 70–99)
Potassium: 4.8 mmol/L (ref 3.5–5.1)
SODIUM: 143 mmol/L (ref 135–145)
TOTAL PROTEIN: 6.6 g/dL (ref 6.5–8.1)

## 2018-02-05 LAB — CUP PACEART INCLINIC DEVICE CHECK
Date Time Interrogation Session: 20200130132711
Implantable Pulse Generator Implant Date: 20180410

## 2018-02-05 NOTE — Progress Notes (Signed)
Hematology and Oncology Follow Up Visit  Roy Lozano [id number] [birth year] 48 y.o. 02/05/2018   Principle Diagnosis:   Factor V Leiden mutation -heterozygous  Cerebrovascular disease-CVA  Hyper homocystinemia  Current Therapy:    Plavix 75 mg p.o. daily  Pradaxa 150 mg p.o. twice daily  Folic acid 1 mg p.o. daily     Interim History:  Roy Lozano is back for second office visit.  We first saw him about 10 months ago.  No monitor why he did not show up for his scheduled appointments.  He looks pretty good.  He has been doing okay.  We did find that he had a elevated homocystine level when we last saw him.  His homocystine level was 25.  He now is on folic acid.  He has had no cardiac issues.  He does have the ICD in place.  He has had no headache.  He has had no seizures.  There is been no neurological issues.  He sees his cardiologist on a relatively frequent basis.  He has had no bleeding.  Overall, his performance status is ECOG 1.  Medications:  Current Outpatient Medications:  .  atorvastatin (LIPITOR) 80 MG tablet, Take 1 tablet (80 mg total) by mouth every evening., Disp: 90 tablet, Rfl: 1 .  clopidogrel (PLAVIX) 75 MG tablet, Take 1 tablet (75 mg total) by mouth daily., Disp: 90 tablet, Rfl: 3 .  ENTRESTO 24-26 MG, TAKE 1 TABLET BY MOUTH 2 (TWO) TIMES DAILY. TO REPLACE LOSARTAN, Disp: 60 tablet, Rfl: 11 .  ezetimibe (ZETIA) 10 MG tablet, Take 1 tablet (10 mg total) by mouth daily., Disp: 90 tablet, Rfl: 3 .  folic acid (FOLVITE) 1 MG tablet, Take 1 tablet (1 mg total) by mouth daily., Disp: 90 tablet, Rfl: 6 .  Levetiracetam 750 MG TB24, Take 1 tablet (750 mg total) by mouth 2 (two) times daily., Disp: 60 tablet, Rfl: 0 .  loperamide (IMODIUM) 2 MG capsule, Take 1 capsule (2 mg total) by mouth 4 (four) times daily as needed for diarrhea or loose stools., Disp: 6 capsule, Rfl: 0 .  metoprolol succinate (TOPROL-XL) 50 MG 24 hr tablet, Take 1 tablet (50 mg  total) by mouth daily. Take with or immediately following a meal., Disp: 90 tablet, Rfl: 3 .  nitroGLYCERIN (NITROSTAT) 0.4 MG SL tablet, Place 1 tablet under the tongue as needed. X 3 doses, Disp: , Rfl:  .  PRADAXA 150 MG CAPS capsule, TAKE 1 CAPSULE BY MOUTH TWICE A DAY, Disp: 180 capsule, Rfl: 1 .  traZODone (DESYREL) 50 MG tablet, Take 1 tablet (50 mg total) by mouth at bedtime., Disp: 30 tablet, Rfl: 4  Allergies: No Known Allergies  Past Medical History, Surgical history, Social history, and Family History were reviewed and updated.  Review of Systems: Review of Systems  Constitutional: Negative.   HENT:  Negative.   Eyes: Negative.   Respiratory: Negative.   Cardiovascular: Negative.   Gastrointestinal: Negative.   Endocrine: Negative.   Genitourinary: Negative.    Musculoskeletal: Negative.   Skin: Negative.   Neurological: Negative.   Hematological: Negative.   Psychiatric/Behavioral: Negative.     Physical Exam:  weight is 187 lb 8 oz (85 kg). His oral temperature is 98 F (36.7 C). His blood pressure is 125/76 and his pulse is 65. His respiration is 16 and oxygen saturation is 99%.   Wt Readings from Last 3 Encounters:  02/04/18 187 lb 8 oz (85 kg)  09/02/17 187 lb 9.6  oz (85.1 kg)  08/14/17 189 lb (85.7 kg)    Physical Exam Vitals signs reviewed.  HENT:     Head: Normocephalic and atraumatic.  Eyes:     Pupils: Pupils are equal, round, and reactive to light.  Neck:     Musculoskeletal: Normal range of motion.  Cardiovascular:     Rate and Rhythm: Normal rate and regular rhythm.     Heart sounds: Normal heart sounds.  Pulmonary:     Effort: Pulmonary effort is normal.     Breath sounds: Normal breath sounds.  Abdominal:     General: Bowel sounds are normal.     Palpations: Abdomen is soft.  Musculoskeletal: Normal range of motion.        General: No tenderness or deformity.  Lymphadenopathy:     Cervical: No cervical adenopathy.  Skin:    General:  Skin is warm and dry.     Findings: No erythema or rash.  Neurological:     Mental Status: He is alert and oriented to person, place, and time.  Psychiatric:        Behavior: Behavior normal.        Thought Content: Thought content normal.        Judgment: Judgment normal.      Lab Results  Component Value Date   WBC 9.3 02/04/2018   HGB 15.1 02/04/2018   HCT 46.5 02/04/2018   MCV 89.8 02/04/2018   PLT 257 02/04/2018     Chemistry      Component Value Date/Time   NA 143 02/04/2018 1306   NA 143 08/28/2017 0944   K 4.8 02/04/2018 1306   CL 107 02/04/2018 1306   CO2 30 02/04/2018 1306   BUN 7 02/04/2018 1306   BUN 7 08/28/2017 0944   CREATININE 1.09 02/04/2018 1306      Component Value Date/Time   CALCIUM 9.8 02/04/2018 1306   ALKPHOS 92 02/04/2018 1306   AST 19 02/04/2018 1306   ALT 23 02/04/2018 1306   BILITOT 0.7 02/04/2018 1306      Impression and Plan: Roy Lozano is a 48 year old white male.  He has the Factor V Leiden mutation.  He is heterozygous for this.  Despite this heterozygous nature, he certainly has a very "active" mutation.  He has had the cerebrovascular and cardiovascular episodes.  He is on lifelong Pradaxa.  He is on lifelong Plavix.  It will be interesting to see what his homocystine levels are.  Hopefully, we will find that the folic acid is bringing them down.  I would like to see him back in 6 months.  I do not see a need for any scans or Dopplers.  Hopefully, his quality of life will improve.   Volanda Napoleon, MD 1/30/20205:52 PM

## 2018-02-06 LAB — CUP PACEART INCLINIC DEVICE CHECK
Date Time Interrogation Session: 20200131133020
Implantable Pulse Generator Implant Date: 20180410

## 2018-03-03 ENCOUNTER — Ambulatory Visit (INDEPENDENT_AMBULATORY_CARE_PROVIDER_SITE_OTHER): Payer: BLUE CROSS/BLUE SHIELD | Admitting: *Deleted

## 2018-03-03 DIAGNOSIS — I4901 Ventricular fibrillation: Secondary | ICD-10-CM | POA: Diagnosis not present

## 2018-03-04 LAB — CUP PACEART REMOTE DEVICE CHECK
Battery Voltage: 3.01 V
Brady Statistic RV Percent Paced: 0.01 %
Date Time Interrogation Session: 20200225081703
HighPow Impedance: 71 Ohm
Implantable Pulse Generator Implant Date: 20180410
Lead Channel Impedance Value: 399 Ohm
Lead Channel Impedance Value: 475 Ohm
Lead Channel Pacing Threshold Pulse Width: 0.4 ms
Lead Channel Sensing Intrinsic Amplitude: 19.75 mV
Lead Channel Sensing Intrinsic Amplitude: 19.75 mV
Lead Channel Setting Pacing Amplitude: 1.75 V
Lead Channel Setting Pacing Pulse Width: 0.4 ms
Lead Channel Setting Sensing Sensitivity: 0.3 mV
MDC IDC MSMT BATTERY REMAINING LONGEVITY: 126 mo
MDC IDC MSMT LEADCHNL RV PACING THRESHOLD AMPLITUDE: 0.875 V

## 2018-03-10 ENCOUNTER — Encounter: Payer: Self-pay | Admitting: Cardiology

## 2018-03-10 NOTE — Progress Notes (Signed)
Remote ICD transmission.   

## 2018-05-15 ENCOUNTER — Other Ambulatory Visit: Payer: Self-pay | Admitting: Cardiovascular Disease

## 2018-05-15 NOTE — Telephone Encounter (Signed)
Clopidogrel refilled

## 2018-05-17 ENCOUNTER — Other Ambulatory Visit: Payer: Self-pay | Admitting: Cardiovascular Disease

## 2018-05-18 NOTE — Telephone Encounter (Signed)
Atorvastatin refilled.  

## 2018-05-21 ENCOUNTER — Other Ambulatory Visit: Payer: Self-pay | Admitting: Cardiovascular Disease

## 2018-05-21 NOTE — Telephone Encounter (Signed)
Zetia refilled.

## 2018-06-02 ENCOUNTER — Encounter: Payer: BLUE CROSS/BLUE SHIELD | Admitting: *Deleted

## 2018-06-03 ENCOUNTER — Telehealth: Payer: Self-pay

## 2018-06-03 NOTE — Telephone Encounter (Signed)
Unable to speak  with patient to remind of missed remote transmission 

## 2018-06-29 ENCOUNTER — Other Ambulatory Visit: Payer: Self-pay | Admitting: Cardiovascular Disease

## 2018-07-03 ENCOUNTER — Other Ambulatory Visit: Payer: Self-pay | Admitting: Cardiovascular Disease

## 2018-07-27 DIAGNOSIS — G40209 Localization-related (focal) (partial) symptomatic epilepsy and epileptic syndromes with complex partial seizures, not intractable, without status epilepticus: Secondary | ICD-10-CM | POA: Diagnosis not present

## 2018-07-27 DIAGNOSIS — I6930 Unspecified sequelae of cerebral infarction: Secondary | ICD-10-CM | POA: Diagnosis not present

## 2018-07-27 DIAGNOSIS — G4700 Insomnia, unspecified: Secondary | ICD-10-CM | POA: Diagnosis not present

## 2018-08-03 ENCOUNTER — Ambulatory Visit (INDEPENDENT_AMBULATORY_CARE_PROVIDER_SITE_OTHER): Payer: BC Managed Care – PPO | Admitting: *Deleted

## 2018-08-03 DIAGNOSIS — I4901 Ventricular fibrillation: Secondary | ICD-10-CM

## 2018-08-03 DIAGNOSIS — I5042 Chronic combined systolic (congestive) and diastolic (congestive) heart failure: Secondary | ICD-10-CM

## 2018-08-03 LAB — CUP PACEART REMOTE DEVICE CHECK
Battery Remaining Longevity: 123 mo
Battery Voltage: 3 V
Brady Statistic RV Percent Paced: 0.01 %
Date Time Interrogation Session: 20200727062826
HighPow Impedance: 69 Ohm
Implantable Pulse Generator Implant Date: 20180410
Lead Channel Impedance Value: 361 Ohm
Lead Channel Impedance Value: 475 Ohm
Lead Channel Pacing Threshold Amplitude: 0.75 V
Lead Channel Pacing Threshold Pulse Width: 0.4 ms
Lead Channel Sensing Intrinsic Amplitude: 21.75 mV
Lead Channel Sensing Intrinsic Amplitude: 21.75 mV
Lead Channel Setting Pacing Amplitude: 1.5 V
Lead Channel Setting Pacing Pulse Width: 0.4 ms
Lead Channel Setting Sensing Sensitivity: 0.3 mV

## 2018-08-05 ENCOUNTER — Inpatient Hospital Stay: Payer: BC Managed Care – PPO

## 2018-08-05 ENCOUNTER — Inpatient Hospital Stay: Payer: BC Managed Care – PPO | Admitting: Hematology & Oncology

## 2018-08-06 ENCOUNTER — Other Ambulatory Visit: Payer: Self-pay | Admitting: Cardiovascular Disease

## 2018-08-19 NOTE — Progress Notes (Signed)
Remote ICD transmission.   

## 2018-08-27 ENCOUNTER — Encounter: Payer: Self-pay | Admitting: *Deleted

## 2018-09-01 ENCOUNTER — Other Ambulatory Visit: Payer: Self-pay | Admitting: Cardiovascular Disease

## 2018-09-02 ENCOUNTER — Telehealth: Payer: Self-pay | Admitting: Cardiovascular Disease

## 2018-09-02 ENCOUNTER — Other Ambulatory Visit: Payer: Self-pay | Admitting: Cardiovascular Disease

## 2018-09-02 NOTE — Telephone Encounter (Signed)
LVM for patient to call and schedule a followup with a PA on Dr. Victorino December team, needs to be a virtual visit.

## 2018-09-03 ENCOUNTER — Other Ambulatory Visit: Payer: Self-pay | Admitting: Cardiovascular Disease

## 2018-09-03 NOTE — Telephone Encounter (Signed)
50m 85kg Scr 1.09 02/04/18 ccr 150mlmin Lovw/croitoru 09/02/17

## 2018-09-08 ENCOUNTER — Other Ambulatory Visit: Payer: Self-pay | Admitting: Cardiovascular Disease

## 2018-09-08 NOTE — Telephone Encounter (Signed)
° ° ° °*  STAT* If patient is at the pharmacy, call can be transferred to refill team.   1. Which medications need to be refilled? (please list name of each medication and dose if known) sacubitril-valsartan (ENTRESTO) 24-26 MG  2. Which pharmacy/location (including street and city if local pharmacy) is medication to be sent to? [facility]  3. Do they need a 30 day or 90 day supply? [place]

## 2018-09-10 ENCOUNTER — Encounter: Payer: Self-pay | Admitting: Family

## 2018-09-10 ENCOUNTER — Inpatient Hospital Stay: Payer: BC Managed Care – PPO | Attending: Hematology & Oncology

## 2018-09-10 ENCOUNTER — Other Ambulatory Visit: Payer: Self-pay

## 2018-09-10 ENCOUNTER — Telehealth: Payer: Self-pay | Admitting: Hematology & Oncology

## 2018-09-10 ENCOUNTER — Inpatient Hospital Stay (HOSPITAL_BASED_OUTPATIENT_CLINIC_OR_DEPARTMENT_OTHER): Payer: BC Managed Care – PPO | Admitting: Family

## 2018-09-10 VITALS — BP 103/92 | HR 64 | Temp 97.8°F | Resp 17

## 2018-09-10 DIAGNOSIS — Z8673 Personal history of transient ischemic attack (TIA), and cerebral infarction without residual deficits: Secondary | ICD-10-CM | POA: Insufficient documentation

## 2018-09-10 DIAGNOSIS — E7211 Homocystinuria: Secondary | ICD-10-CM

## 2018-09-10 DIAGNOSIS — D6851 Activated protein C resistance: Secondary | ICD-10-CM | POA: Diagnosis not present

## 2018-09-10 LAB — CBC WITH DIFFERENTIAL (CANCER CENTER ONLY)
Abs Immature Granulocytes: 0.03 10*3/uL (ref 0.00–0.07)
Basophils Absolute: 0.1 10*3/uL (ref 0.0–0.1)
Basophils Relative: 1 %
Eosinophils Absolute: 0.4 10*3/uL (ref 0.0–0.5)
Eosinophils Relative: 5 %
HCT: 47.8 % (ref 39.0–52.0)
Hemoglobin: 15.7 g/dL (ref 13.0–17.0)
Immature Granulocytes: 0 %
Lymphocytes Relative: 19 %
Lymphs Abs: 1.9 10*3/uL (ref 0.7–4.0)
MCH: 29.4 pg (ref 26.0–34.0)
MCHC: 32.8 g/dL (ref 30.0–36.0)
MCV: 89.5 fL (ref 80.0–100.0)
Monocytes Absolute: 0.7 10*3/uL (ref 0.1–1.0)
Monocytes Relative: 7 %
Neutro Abs: 6.7 10*3/uL (ref 1.7–7.7)
Neutrophils Relative %: 68 %
Platelet Count: 271 10*3/uL (ref 150–400)
RBC: 5.34 MIL/uL (ref 4.22–5.81)
RDW: 12.5 % (ref 11.5–15.5)
WBC Count: 9.8 10*3/uL (ref 4.0–10.5)
nRBC: 0 % (ref 0.0–0.2)

## 2018-09-10 LAB — CMP (CANCER CENTER ONLY)
ALT: 22 U/L (ref 0–44)
AST: 19 U/L (ref 15–41)
Albumin: 4.4 g/dL (ref 3.5–5.0)
Alkaline Phosphatase: 116 U/L (ref 38–126)
Anion gap: 7 (ref 5–15)
BUN: 6 mg/dL (ref 6–20)
CO2: 32 mmol/L (ref 22–32)
Calcium: 9.6 mg/dL (ref 8.9–10.3)
Chloride: 106 mmol/L (ref 98–111)
Creatinine: 1.16 mg/dL (ref 0.61–1.24)
GFR, Est AFR Am: >60 mL/min (ref >60)
GFR, Estimated: >60 mL/min (ref >60)
Glucose, Bld: 96 mg/dL (ref 70–99)
Potassium: 4.5 mmol/L (ref 3.5–5.1)
Sodium: 145 mmol/L (ref 135–145)
Total Bilirubin: 1 mg/dL (ref 0.3–1.2)
Total Protein: 7 g/dL (ref 6.5–8.1)

## 2018-09-10 LAB — D-DIMER, QUANTITATIVE: D-Dimer, Quant: 0.27 ug/mL-FEU (ref 0.00–0.50)

## 2018-09-10 NOTE — Progress Notes (Signed)
Hematology and Oncology Follow Up Visit  Roy Lozano [id number] [birth year] 48 y.o. 09/10/2018   Principle Diagnosis:  Factor V Leiden mutation -heterozygous Cerebrovascular disease-CVA Hyper homocystinemia  Current Therapy:   Plavix 75 mg p.o. daily Pradaxa 150 mg p.o. twice daily Folic acid 1 mg p.o. daily   Interim History:  [name] is here today for follow-up. He is doing well and has no complaints at this time.  He states that he is taking his Plavix, Pradaxa and folic acid daily as prescribed.  No issues with bleeding. No bruising or petechiae.  CBC unremarkable. D-dimer stable at 0.27. No fever, chills, n/v, cough, rash, dizziness, SOB, chest pain, palpitations, abdominal pain or changes in bowel or bladder habits.  No swelling, tenderness, numbness or tingling in his extremities.  He has maintained a good appetite and is staying well hydrated. His weight is stable.  He is active walking and doing yard work for exercise.   ECOG Performance Status: 1 - Symptomatic but completely ambulatory  Medications:  Allergies as of 09/10/2018   No Known Allergies     Medication List       Accurate as of September 10, 2018 11:58 AM. If you have any questions, ask your nurse or doctor.        atorvastatin 80 MG tablet Commonly known as: LIPITOR Take 1 tablet (80 mg total) by mouth daily at 6 PM. *NEEDS OFFICE VISIT* 2ND ATTEMPT   clopidogrel 75 MG tablet Commonly known as: PLAVIX TAKE 1 TABLET BY MOUTH EVERY DAY   Entresto 24-26 MG Generic drug: sacubitril-valsartan Take 1 tablet by mouth 2 (two) times daily. *NEEDS OFFICE VISIT* 2ND ATTEMPT   ezetimibe 10 MG tablet Commonly known as: ZETIA TAKE 1 TABLET BY MOUTH EVERY DAY   folic acid 1 MG tablet Commonly known as: FOLVITE Take 1 tablet (1 mg total) by mouth daily.   Levetiracetam 750 MG Tb24 Take 1 tablet (750 mg total) by mouth 2 (two) times daily.   loperamide 2 MG capsule Commonly known as: IMODIUM Take  1 capsule (2 mg total) by mouth 4 (four) times daily as needed for diarrhea or loose stools.   metoprolol succinate 50 MG 24 hr tablet Commonly known as: TOPROL-XL TAKE 1 TABLET (50 MG TOTAL) BY MOUTH DAILY. TAKE WITH OR IMMEDIATELY FOLLOWING A MEAL.   nitroGLYCERIN 0.4 MG SL tablet Commonly known as: NITROSTAT Place 1 tablet under the tongue as needed. X 3 doses   Pradaxa 150 MG Caps capsule Generic drug: dabigatran TAKE 1 CAPSULE (150 MG TOTAL) BY MOUTH 2 (TWO) TIMES DAILY. CALL OFFICE FOR APPOINTMENT   traZODone 50 MG tablet Commonly known as: DESYREL Take 1 tablet (50 mg total) by mouth at bedtime.       Allergies: No Known Allergies  Past Medical History, Surgical history, Social history, and Family History were reviewed and updated.  Review of Systems: All other 10 point review of systems is negative.   Physical Exam:  vitals were not taken for this visit.   Wt Readings from Last 3 Encounters:  02/04/18 187 lb 8 oz (85 kg)  09/02/17 187 lb 9.6 oz (85.1 kg)  08/14/17 189 lb (85.7 kg)    Ocular: Sclerae unicteric, pupils equal, round and reactive to light Ear-nose-throat: Oropharynx clear, dentition fair Lymphatic: No cervical or supraclavicular adenopathy Lungs no rales or rhonchi, good excursion bilaterally Heart regular rate and rhythm, no murmur appreciated Abd soft, nontender, positive bowel sounds, no liver or spleen tip palpated  on exam, no fluid wave  MSK no focal spinal tenderness, no joint edema Neuro: non-focal, well-oriented, appropriate affect Breasts: Deferred   Lab Results  Component Value Date   WBC 9.8 09/10/2018   HGB 15.7 09/10/2018   HCT 47.8 09/10/2018   MCV 89.5 09/10/2018   PLT 271 09/10/2018   No results found for: FERRITIN, IRON, TIBC, UIBC, IRONPCTSAT Lab Results  Component Value Date   RBC 5.34 09/10/2018   No results found for: KPAFRELGTCHN, LAMBDASER, KAPLAMBRATIO No results found for: IGGSERUM, IGA, IGMSERUM No results  found for: Odetta Pink, SPEI   Chemistry      Component Value Date/Time   NA 143 02/04/2018 1306   NA 143 08/28/2017 0944   K 4.8 02/04/2018 1306   CL 107 02/04/2018 1306   CO2 30 02/04/2018 1306   BUN 7 02/04/2018 1306   BUN 7 08/28/2017 0944   CREATININE 1.09 02/04/2018 1306      Component Value Date/Time   CALCIUM 9.8 02/04/2018 1306   ALKPHOS 92 02/04/2018 1306   AST 19 02/04/2018 1306   ALT 23 02/04/2018 1306   BILITOT 0.7 02/04/2018 1306       Impression and Plan: Ms. Roy Lozano is a pleasant 48 yo caucasian gentleman with Factor V Leiden mutation, heterozygous, history of cerebrovascular CVA and hyper homocystinemia.  He continues to do well on daily Pradaxa, Plavix and folic acid. No complaints at this time.  We will plan to see him back in another 6 month.  He will contact our office with any questions or concerns. We can certainly see him sooner if needed.   Laverna Peace, NP 9/3/202011:58 AM

## 2018-09-10 NOTE — Telephone Encounter (Signed)
lmom to inform patient of March 2021 a[[t per 9/3 LOS

## 2018-09-15 NOTE — Progress Notes (Signed)
NO SHOW

## 2018-09-16 ENCOUNTER — Encounter: Payer: BC Managed Care – PPO | Admitting: Adult Health

## 2018-09-21 ENCOUNTER — Telehealth: Payer: Self-pay | Admitting: Cardiovascular Disease

## 2018-09-21 ENCOUNTER — Other Ambulatory Visit: Payer: Self-pay

## 2018-09-21 MED ORDER — ENTRESTO 24-26 MG PO TABS: 1.0000 | ORAL_TABLET | Freq: Two times a day (BID) | ORAL | 0 refills | Status: DC

## 2018-09-21 NOTE — Telephone Encounter (Signed)
New Message    *STAT* If patient is at the pharmacy, call can be transferred to refill team.   1. Which medications need to be refilled? (please list name of each medication and dose if known) sacubitril-valsartan (ENTRESTO) 24-26 MG  2. Which pharmacy/location (including street and city if local pharmacy) is medication to be sent to? [id number] [place]. Do they need a 30 day or 90 day supply? 90 day

## 2018-09-23 ENCOUNTER — Other Ambulatory Visit: Payer: Self-pay | Admitting: Cardiovascular Disease

## 2018-09-23 ENCOUNTER — Other Ambulatory Visit: Payer: Self-pay

## 2018-09-23 NOTE — Telephone Encounter (Addendum)
° ° °  Patient is out of his medication  [facility]   [facility]     *STAT* If patient is at the pharmacy, call can be transferred to refill team.   1. Which medications need to be refilled? (please list name of each medication and dose if known) ENTRESTO  2. Which pharmacy/location (including street and city if local pharmacy) is medication to be sent to?[id number] [place]. Do they need a 30 day or 90 day supply? [place]

## 2018-09-25 ENCOUNTER — Other Ambulatory Visit: Payer: Self-pay | Admitting: Cardiovascular Disease

## 2018-10-23 ENCOUNTER — Other Ambulatory Visit: Payer: Self-pay | Admitting: Cardiovascular Disease

## 2018-11-02 ENCOUNTER — Other Ambulatory Visit: Payer: Self-pay

## 2018-11-02 ENCOUNTER — Ambulatory Visit (INDEPENDENT_AMBULATORY_CARE_PROVIDER_SITE_OTHER): Payer: BC Managed Care – PPO | Admitting: Cardiovascular Disease

## 2018-11-02 ENCOUNTER — Encounter: Payer: Self-pay | Admitting: Cardiovascular Disease

## 2018-11-02 VITALS — BP 120/86 | HR 82 | Ht 74.0 in | Wt 177.0 lb

## 2018-11-02 DIAGNOSIS — I4901 Ventricular fibrillation: Secondary | ICD-10-CM

## 2018-11-02 DIAGNOSIS — I2510 Atherosclerotic heart disease of native coronary artery without angina pectoris: Secondary | ICD-10-CM | POA: Diagnosis not present

## 2018-11-02 DIAGNOSIS — N522 Drug-induced erectile dysfunction: Secondary | ICD-10-CM

## 2018-11-02 DIAGNOSIS — I5042 Chronic combined systolic (congestive) and diastolic (congestive) heart failure: Secondary | ICD-10-CM

## 2018-11-02 DIAGNOSIS — D6851 Activated protein C resistance: Secondary | ICD-10-CM | POA: Diagnosis not present

## 2018-11-02 DIAGNOSIS — Z7901 Long term (current) use of anticoagulants: Secondary | ICD-10-CM

## 2018-11-02 DIAGNOSIS — E785 Hyperlipidemia, unspecified: Secondary | ICD-10-CM

## 2018-11-02 DIAGNOSIS — Z95810 Presence of automatic (implantable) cardiac defibrillator: Secondary | ICD-10-CM

## 2018-11-02 DIAGNOSIS — I69359 Hemiplegia and hemiparesis following cerebral infarction affecting unspecified side: Secondary | ICD-10-CM

## 2018-11-02 MED ORDER — SILDENAFIL CITRATE 50 MG PO TABS: 50.0000 mg | ORAL_TABLET | Freq: Every day | ORAL | 3 refills | Status: DC | PRN

## 2018-11-02 NOTE — Progress Notes (Signed)
Cardiology Office Note:    Date:  11/05/2018   ID:  Roy Lozano, DOB Feb 15, 1970, MRN [id number]  PCP:  Patient, No Pcp Per  Cardiologist:  Jannetta Massey  Referring MD: No ref. provider found   Chief Complaint  Patient presents with  . Congestive Heart Failure  . Coronary Artery Disease  . Pacemaker Check    ICD    History of Present Illness:    Roy Lozano is a 48 y.o. male with a hx of previous myocardial infarction (suspect anterior STEMI), history of cardiac arrest in April 2018, followed by implantation of a single-chamber defibrillator with 2 subsequent appropriate defibrillator discharges for ventricular fibrillation, most recently in January 2019.  (while living in [place]).    The patient specifically denies any chest pain at rest exertion, dyspnea at rest or with exertion, orthopnea, paroxysmal nocturnal dyspnea, syncope, palpitations, focal neurological deficits, intermittent claudication, lower extremity edema, unexplained weight gain, cough, hemoptysis or wheezing.  He has a new girlfriend and has problems with erectile dysfunction and is interested in using PDE 5 inhibitors.  Interrogation of his defibrillator shows that his OptiVol has recently exceeded threshold, but his birthday was a few days ago when he had a lot of salty food.  It is trending back to baseline.  Otherwise device interrogation is normal.  Estimated generator longevity is 10 years.  He does not require ventricular pacing and has not had any episodes of ventricular tachycardia (either sustained or nonsustained) or atrial fibrillation.  Interrogation of the device shows that he has delivered 2 shocks.  1 of them was in November 2018 and the details have been deleted from the device memory.  The other one occurred on February 04 2017 at 0351 hrs.  He had abrupt onset of what appears to be polymorphic VT/VF with a cycle length varying between 130 and 190 ms and appropriate single defibrillator  discharge at 36 J to convert the rhythm to ventricular pacing.  He was unaware of the discharge which occurred in the middle of the night.  He was warned of its occurrence due to the device alarms.  Unfortunately, [name] also has some mild-moderate memory issues since his cardiac arrest and has not always available to provide details about his medical problems.  He is clearly a very intelligent and educated gentleman, he is here alone today.  He reports that his cardiac arrest occurred during hospitalization for pneumonia in April 2018.   It is not clear about the sequence of events but he had a large heart attack of the front wall of his heart.  He received stents.  He has also had a stroke which has left him with mild left-sided hemiparesis.  He states that his left hand is always cold.  He has limited dexterity and ability to grasp without limb and is applying for disability based on this.  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  He is now living with his 38 year old mother.  In addition to his cardiac problem has a history of epilepsy and factor V Leiden deficiency and hyper homocystinemia.  He reports having had a previous stroke at age 66.  His seizure disorder took a long time to diagnose accurately and treat, but is currently well controlled with antiepileptic medications.  He has a history of hypertension and hyperlipidemia.  He smoked very briefly and in very small amounts: roughly a pack every 2 weeks for 1-1/2 years, quit 2018.  His list of medications includes clopidogrel and Pradaxa as well  as a beta-blocker and low dose angiotensin receptor blocker.  He has been prescribed high-dose atorvastatin.  Past Medical History:  Diagnosis Date  . AMI (acute myocardial infarction) ([place])   . Arrhythmia   . Cardiac arrest ([place])   . Clotting disorder ([place])    factor 5 Leiden   . Coronary artery disease   . CVA (cerebral vascular accident) ([place])   . Epilepsy ([place])   . Heart disease   .  Hyperlipidemia   . Hypertension   . IBS (irritable bowel syndrome)   . Seizures ([place])   . Stroke ([place])   . Ulcerative colitis [facility])     Past Surgical History:  Procedure Laterality Date  . CARDIAC CATHETERIZATION    . CARDIAC DEFIBRILLATOR PLACEMENT    . ICD IMPLANT Left 04/16/2016   [employer]    Current Medications:  Current Outpatient Medications on File Prior to Visit  Medication Sig Dispense Refill  . clopidogrel (PLAVIX) 75 MG tablet TAKE 1 TABLET BY MOUTH EVERY DAY 90 tablet 2  . ENTRESTO 24-26 MG TAKE 1 TABLET BY MOUTH 2 (TWO) TIMES DAILY. *NEEDS OFFICE VISIT* 2ND ATTEMPT 60 tablet 0  . ezetimibe (ZETIA) 10 MG tablet TAKE 1 TABLET BY MOUTH EVERY DAY 90 tablet 2  . folic acid (FOLVITE) 1 MG tablet Take 1 tablet (1 mg total) by mouth daily. 90 tablet 6  . Levetiracetam 750 MG TB24 Take 1 tablet (750 mg total) by mouth 2 (two) times daily. 60 tablet 0  . loperamide (IMODIUM) 2 MG capsule Take 1 capsule (2 mg total) by mouth 4 (four) times daily as needed for diarrhea or loose stools. 6 capsule 0  . PRADAXA 150 MG CAPS capsule TAKE 1 CAPSULE (150 MG TOTAL) BY MOUTH 2 (TWO) TIMES DAILY. CALL OFFICE FOR APPOINTMENT 60 capsule 0  . traZODone (DESYREL) 50 MG tablet Take 1 tablet (50 mg total) by mouth at bedtime. 30 tablet 4  . metoprolol succinate (TOPROL-XL) 50 MG 24 hr tablet TAKE 1 TABLET (50 MG TOTAL) BY MOUTH DAILY. TAKE WITH OR IMMEDIATELY FOLLOWING A MEAL. 90 tablet 3   No current facility-administered medications on file prior to visit.       Allergies:   Patient has no known allergies.   Social History   Socioeconomic History  . Marital status: Single    Spouse name: Not on file  . Number of children: Not on file  . Years of education: Not on file  . Highest education level: Not on file  Occupational History  . Not on file  Social Needs  . Financial resource strain: Not on file  . Food insecurity    Worry: Not on file    Inability: Not on file  .  Transportation needs    Medical: Not on file    Non-medical: Not on file  Tobacco Use  . Smoking status: Former Smoker    Types: Cigarettes    Start date: 03/03/2016  . Smokeless tobacco: Never Used  Substance and Sexual Activity  . Alcohol use: Yes    Comment: rarely  . Drug use: No  . Sexual activity: Not on file  Lifestyle  . Physical activity    Days per week: Not on file    Minutes per session: Not on file  . Stress: Not on file  Relationships  . Social Herbalist on phone: Not on file    Gets together: Not on file    Attends religious service: Not  on file    Active member of club or organization: Not on file    Attends meetings of clubs or organizations: Not on file    Relationship status: Not on file  Other Topics Concern  . Not on file  Social History Narrative   ** Merged History Encounter **         Family History: The patient's family history includes Heart attack in his father; Heart disease in his paternal grandfather and paternal grandmother; Stroke in his maternal grandfather and maternal grandmother.  ROS:   Please see the history of present illness.  All other systems are reviewed and are negative.  EKGs/Labs/Other Studies Reviewed:    The following studies were reviewed today: ICD check  EKG:  EKG isordered today.  Normal sinus rhythm, normal tracing  Recent Labs: 09/10/2018: ALT 22; BUN 6; Creatinine 1.16; Hemoglobin 15.7; Platelet Count 271; Potassium 4.5; Sodium 145  Recent Lipid Panel    Component Value Date/Time   CHOL 128 11/02/2018 1036   TRIG 95 11/02/2018 1036   HDL 34 (L) 11/02/2018 1036   CHOLHDL 3.8 11/02/2018 1036   LDLCALC 76 11/02/2018 1036    Physical Exam:    VS:  BP 120/86   Pulse 82   Ht 6' 2"  (1.88 m)   Wt 177 lb (80.3 kg)   SpO2 99%   BMI 22.73 kg/m     Wt Readings from Last 3 Encounters:  11/02/18 177 lb (80.3 kg)  02/04/18 187 lb 8 oz (85 kg)  09/02/17 187 lb 9.6 oz (85.1 kg)     General: Alert,  oriented x3, no distress, he is very lean. Head: no evidence of trauma, PERRL, EOMI, no exophtalmos or lid lag, no myxedema, no xanthelasma; normal ears, nose and oropharynx Neck: normal jugular venous pulsations and no hepatojugular reflux; brisk carotid pulses without delay and no carotid bruits Chest: clear to auscultation, no signs of consolidation by percussion or palpation, normal fremitus, symmetrical and full respiratory excursions Cardiovascular: normal position and quality of the apical impulse, regular rhythm, normal first and second heart sounds, no murmurs, rubs or gallops Abdomen: no tenderness or distention, no masses by palpation, no abnormal pulsatility or arterial bruits, normal bowel sounds, no hepatosplenomegaly Extremities: no clubbing, cyanosis or edema; 2+ radial, ulnar and brachial pulses bilaterally; 2+ right femoral, posterior tibial and dorsalis pedis pulses; 2+ left femoral, posterior tibial and dorsalis pedis pulses; no subclavian or femoral bruits Psych: Normal mood and affect Neurological: Alert and oriented x 3, mild 4/5 weakness in the left upper extremity, mild loss of dexterity in the left hand slight ataxia.  Subtle difficulty with memory is apparent.  Otherwise nonfocal neurological exam   ASSESSMENT:    1. Ventricular fibrillation ([place])   2. Coronary artery disease involving native coronary artery of native heart without angina pectoris   3. Chronic combined systolic and diastolic heart failure ([place])   4. Factor V Leiden mutation ([place])   5. Long term current use of anticoagulant   6. Hemiparesis due to old stroke ([place])   7. Dyslipidemia (high LDL; low HDL)   8. ICD (implantable cardioverter-defibrillator) in place   9. Drug-induced erectile dysfunction    PLAN:    In order of problems listed above:  1. VF: No evidence of any ventricular arrhythmia.  On beta-blocker. 2. CAD: Asymptomatic, no angina pectoris. He is on clopidogrel and statin therapy as  well as beta-blockers.  We do not have the details of his coronary anatomy  at his cardiac catheterization in [place].  We will continue dual antiplatelet therapy unless her bleeding complications. The wall motion abnormalities on echo were consistent with infarction in the territory of a large "wraparound" LAD artery 3. CHF: NYHA functional class I-II. clinically is maintaining euvolemia without the need for any diuretic therapy.  Recent thoracic impedance deviation is returning to baseline.  Reviewed the importance of a sodium restricted diet and weight monitoring.  EF was 35-40% by echo performed in our facility in May, presumably an improvement from previous assessments.  Continue Entresto, metoprolol succinate. 4. Factor V Leiden and hyper homocystinemia: Followed by Dr. Marin Olp.  He is in agreement with the choice of oral anticoagulant and feels that his procoagulant condition has been important contributor to his stroke at age 52. 5. Pradaxa: Well-tolerated, no bleeding 6. L hemiparesis: Loss of dexterity impairs his ability to use devices such as keyboard and computer mouse. Together with his memory impairment, this will make it very challenging to have a job. 7. HLP: Lipid profile checked today shows all parameters close to target with the exception of low HDL.  This will only improve with increased physical activity. 8. ICD: Normal device function.  Has had 2 appropriate shocks for ventricular fibrillation more than a year ago.  On beta-blockers.   9. ED: I think he is healthy enough for sexual intercourse.  Discussed the potentially lethal interaction between nitrates and PDE 5 inhibitors.  He should not take nitroglycerin or any type of nitro product within 24 hours of a dose of sildenafil.  Medication Adjustments/Labs and Tests Ordered: Current medicines are reviewed at length with the patient today.  Concerns regarding medicines are outlined above.  Orders Placed This Encounter   Procedures  . Lipid panel  . EKG 12-Lead   Meds ordered this encounter  Medications  . sildenafil (VIAGRA) 50 MG tablet    Sig: Take 1 tablet (50 mg total) by mouth daily as needed for erectile dysfunction.    Dispense:  6 tablet    Refill:  3    Signed, Sanda Klein, MD  11/05/2018 3:18 PM    [place]

## 2018-11-02 NOTE — Patient Instructions (Signed)
Medication Instructions:  TAKE AS NEEDED: Sildenafil 50 mg once daily as needed  *If you need a refill on your cardiac medications before your next appointment, please call your pharmacy*  Lab Work: Your provider would like for you to have the following labs today: Lipids  If you have labs (blood work) drawn today and your tests are completely normal, you will receive your results only by: Marland Kitchen MyChart Message (if you have MyChart) OR . A paper copy in the mail If you have any lab test that is abnormal or we need to change your treatment, we will call you to review the results.  Testing/Procedures: None ordered  Follow-Up: At [facility], you and your health needs are our priority.  As part of our continuing mission to provide you with exceptional heart care, we have created designated Provider Care Teams.  These Care Teams include your primary Cardiologist (physician) and Advanced Practice Providers (APPs -  Physician Assistants and Nurse Practitioners) who all work together to provide you with the care you need, when you need it.  Your next appointment:   12 months  The format for your next appointment:   In Person  Provider:   Sanda Klein, MD

## 2018-11-03 ENCOUNTER — Ambulatory Visit (INDEPENDENT_AMBULATORY_CARE_PROVIDER_SITE_OTHER): Payer: BC Managed Care – PPO | Admitting: *Deleted

## 2018-11-03 DIAGNOSIS — I5042 Chronic combined systolic (congestive) and diastolic (congestive) heart failure: Secondary | ICD-10-CM

## 2018-11-03 DIAGNOSIS — I4901 Ventricular fibrillation: Secondary | ICD-10-CM

## 2018-11-03 LAB — LIPID PANEL
Chol/HDL Ratio: 3.8 ratio (ref 0.0–5.0)
Cholesterol, Total: 128 mg/dL (ref 100–199)
HDL: 34 mg/dL — ABNORMAL LOW (ref >39)
LDL Chol Calc (NIH): 76 mg/dL (ref 0–99)
Triglycerides: 95 mg/dL (ref 0–149)
VLDL Cholesterol Cal: 18 mg/dL (ref 5–40)

## 2018-11-04 ENCOUNTER — Other Ambulatory Visit: Payer: Self-pay | Admitting: Cardiovascular Disease

## 2018-11-04 LAB — CUP PACEART REMOTE DEVICE CHECK
Battery Remaining Longevity: 120 mo
Battery Voltage: 2.99 V
Brady Statistic RV Percent Paced: 0.01 %
Date Time Interrogation Session: 20201028202354
HighPow Impedance: 67 Ohm
Implantable Pulse Generator Implant Date: 20180410
Lead Channel Impedance Value: 418 Ohm
Lead Channel Impedance Value: 475 Ohm
Lead Channel Pacing Threshold Amplitude: 0.875 V
Lead Channel Pacing Threshold Pulse Width: 0.4 ms
Lead Channel Sensing Intrinsic Amplitude: 18.875 mV
Lead Channel Sensing Intrinsic Amplitude: 18.875 mV
Lead Channel Setting Pacing Amplitude: 1.75 V
Lead Channel Setting Pacing Pulse Width: 0.4 ms
Lead Channel Setting Sensing Sensitivity: 0.3 mV

## 2018-11-05 ENCOUNTER — Encounter: Payer: Self-pay | Admitting: Cardiovascular Disease

## 2018-11-05 ENCOUNTER — Encounter: Payer: Self-pay | Admitting: *Deleted

## 2018-11-19 ENCOUNTER — Other Ambulatory Visit: Payer: Self-pay | Admitting: Cardiovascular Disease

## 2018-11-24 NOTE — Progress Notes (Signed)
Remote ICD transmission.   

## 2018-12-07 ENCOUNTER — Other Ambulatory Visit: Payer: Self-pay | Admitting: Cardiovascular Disease

## 2018-12-07 ENCOUNTER — Telehealth: Payer: Self-pay | Admitting: Cardiovascular Disease

## 2018-12-07 MED ORDER — ENTRESTO 24-26 MG PO TABS: 1.0000 | ORAL_TABLET | Freq: Two times a day (BID) | ORAL | 3 refills | Status: DC

## 2018-12-07 NOTE — Telephone Encounter (Signed)
New message  Pt c/o medication issue:  1. Name of Medication: ENTRESTO 24-26 MG  2. How are you currently taking this medication (dosage and times per day)? TAKE 1 TABLET BY MOUTH 2 (TWO) TIMES DAILY. *NEEDS OFFICE VISIT* 2ND ATTEMPT  3. Are you having a reaction (difficulty breathing--STAT)? No  4. What is your medication issue? Out of medication. [id number] [place]

## 2018-12-07 NOTE — Telephone Encounter (Signed)
Sent in medication to pharmacy provided.

## 2018-12-08 NOTE — Telephone Encounter (Signed)
Outpatient Medication Detail   Disp Refills Start End   sacubitril-valsartan (ENTRESTO) 24-26 MG 60 tablet 3 12/07/2018    Sig - Route: Take 1 tablet by mouth 2 (two) times daily. - Oral   Sent to pharmacy as: sacubitril-valsartan (ENTRESTO) 24-26 MG   Notes to Pharmacy: Please call our office and schedule yearly appointments for further refills   E-Prescribing Status: Receipt confirmed by pharmacy (12/07/2018 2:57 PM EST)   [facility]  [id number] [place], [place] - [address]    

## 2018-12-08 NOTE — Telephone Encounter (Signed)
Outpatient Medication Detail   Disp Refills Start End   sacubitril-valsartan (ENTRESTO) 24-26 MG 60 tablet 3 12/07/2018    Sig - Route: Take 1 tablet by mouth 2 (two) times daily. - Oral   Sent to pharmacy as: sacubitril-valsartan (ENTRESTO) 24-26 MG   Notes to Pharmacy: Please call our office and schedule yearly appointments for further refills   E-Prescribing Status: Receipt confirmed by pharmacy (12/07/2018 2:57 PM EST)   [facility]  [id number] [place], [place] - [address]

## 2018-12-18 ENCOUNTER — Other Ambulatory Visit: Payer: Self-pay | Admitting: Cardiovascular Disease

## 2018-12-18 NOTE — Telephone Encounter (Signed)
48 M 80.3 kg, Scr 1.16 (09/2018), LOV Croitoru 10/2018

## 2019-01-25 ENCOUNTER — Other Ambulatory Visit: Payer: Self-pay | Admitting: Cardiovascular Disease

## 2019-02-02 ENCOUNTER — Ambulatory Visit (INDEPENDENT_AMBULATORY_CARE_PROVIDER_SITE_OTHER): Payer: BC Managed Care – PPO | Admitting: *Deleted

## 2019-02-02 DIAGNOSIS — I4901 Ventricular fibrillation: Secondary | ICD-10-CM

## 2019-02-02 LAB — CUP PACEART REMOTE DEVICE CHECK
Battery Remaining Longevity: 115 mo
Battery Voltage: 3 V
Brady Statistic RV Percent Paced: 0.03 %
Date Time Interrogation Session: 20210126033525
HighPow Impedance: 65 Ohm
Implantable Pulse Generator Implant Date: 20180410
Lead Channel Impedance Value: 399 Ohm
Lead Channel Impedance Value: 456 Ohm
Lead Channel Pacing Threshold Amplitude: 0.75 V
Lead Channel Pacing Threshold Pulse Width: 0.4 ms
Lead Channel Sensing Intrinsic Amplitude: 20.375 mV
Lead Channel Sensing Intrinsic Amplitude: 20.375 mV
Lead Channel Setting Pacing Amplitude: 1.5 V
Lead Channel Setting Pacing Pulse Width: 0.4 ms
Lead Channel Setting Sensing Sensitivity: 0.3 mV

## 2019-02-08 ENCOUNTER — Other Ambulatory Visit: Payer: Self-pay | Admitting: Cardiovascular Disease

## 2019-02-18 ENCOUNTER — Other Ambulatory Visit: Payer: Self-pay | Admitting: Cardiovascular Disease

## 2019-03-10 ENCOUNTER — Encounter: Payer: Self-pay | Admitting: Hematology & Oncology

## 2019-03-10 ENCOUNTER — Inpatient Hospital Stay: Payer: BC Managed Care – PPO

## 2019-03-10 ENCOUNTER — Other Ambulatory Visit: Payer: Self-pay

## 2019-03-10 ENCOUNTER — Inpatient Hospital Stay: Payer: BC Managed Care – PPO | Attending: Hematology & Oncology | Admitting: Hematology & Oncology

## 2019-03-10 VITALS — BP 130/99 | HR 58 | Temp 97.3°F | Resp 20 | Wt 184.0 lb

## 2019-03-10 DIAGNOSIS — E7211 Homocystinuria: Secondary | ICD-10-CM

## 2019-03-10 DIAGNOSIS — D6851 Activated protein C resistance: Secondary | ICD-10-CM | POA: Diagnosis not present

## 2019-03-10 DIAGNOSIS — Z95810 Presence of automatic (implantable) cardiac defibrillator: Secondary | ICD-10-CM | POA: Diagnosis not present

## 2019-03-10 DIAGNOSIS — Z8673 Personal history of transient ischemic attack (TIA), and cerebral infarction without residual deficits: Secondary | ICD-10-CM | POA: Insufficient documentation

## 2019-03-10 DIAGNOSIS — Z7901 Long term (current) use of anticoagulants: Secondary | ICD-10-CM | POA: Insufficient documentation

## 2019-03-10 DIAGNOSIS — I69354 Hemiplegia and hemiparesis following cerebral infarction affecting left non-dominant side: Secondary | ICD-10-CM | POA: Diagnosis not present

## 2019-03-10 LAB — CBC WITH DIFFERENTIAL (CANCER CENTER ONLY)
Abs Immature Granulocytes: 0.03 10*3/uL (ref 0.00–0.07)
Basophils Absolute: 0.1 10*3/uL (ref 0.0–0.1)
Basophils Relative: 1 %
Eosinophils Absolute: 1 10*3/uL — ABNORMAL HIGH (ref 0.0–0.5)
Eosinophils Relative: 13 %
HCT: 49.2 % (ref 39.0–52.0)
Hemoglobin: 16.3 g/dL (ref 13.0–17.0)
Immature Granulocytes: 0 %
Lymphocytes Relative: 25 %
Lymphs Abs: 1.9 10*3/uL (ref 0.7–4.0)
MCH: 29.7 pg (ref 26.0–34.0)
MCHC: 33.1 g/dL (ref 30.0–36.0)
MCV: 89.8 fL (ref 80.0–100.0)
Monocytes Absolute: 0.6 10*3/uL (ref 0.1–1.0)
Monocytes Relative: 8 %
Neutro Abs: 4.1 10*3/uL (ref 1.7–7.7)
Neutrophils Relative %: 53 %
Platelet Count: 270 10*3/uL (ref 150–400)
RBC: 5.48 MIL/uL (ref 4.22–5.81)
RDW: 13 % (ref 11.5–15.5)
WBC Count: 7.7 10*3/uL (ref 4.0–10.5)
nRBC: 0 % (ref 0.0–0.2)

## 2019-03-10 LAB — CMP (CANCER CENTER ONLY)
ALT: 38 U/L (ref 0–44)
AST: 29 U/L (ref 15–41)
Albumin: 4.5 g/dL (ref 3.5–5.0)
Alkaline Phosphatase: 104 U/L (ref 38–126)
Anion gap: 7 (ref 5–15)
BUN: <5 mg/dL — ABNORMAL LOW (ref 6–20)
CO2: 32 mmol/L (ref 22–32)
Calcium: 9.7 mg/dL (ref 8.9–10.3)
Chloride: 106 mmol/L (ref 98–111)
Creatinine: 1.03 mg/dL (ref 0.61–1.24)
GFR, Est AFR Am: >60 mL/min (ref >60)
GFR, Estimated: >60 mL/min (ref >60)
Glucose, Bld: 98 mg/dL (ref 70–99)
Potassium: 3.6 mmol/L (ref 3.5–5.1)
Sodium: 145 mmol/L (ref 135–145)
Total Bilirubin: 1 mg/dL (ref 0.3–1.2)
Total Protein: 7.1 g/dL (ref 6.5–8.1)

## 2019-03-10 LAB — D-DIMER, QUANTITATIVE: D-Dimer, Quant: 0.37 ug/mL-FEU (ref 0.00–0.50)

## 2019-03-10 NOTE — Progress Notes (Signed)
Hematology and Oncology Follow Up Visit  Roy Lozano [id number] 1970-12-25 49 y.o. 03/10/2019   Principle Diagnosis:  Factor V Leiden mutation -heterozygous Cerebrovascular disease-CVA Hyper homocystinemia  Current Therapy:   Plavix 75 mg p.o. daily Pradaxa 150 mg p.o. twice daily Folic acid 1 mg p.o. daily   Interim History:  Roy Lozano is here today for follow-up. He is doing well and has no complaints at this time.   So far, he is doing pretty well.  He is on Plavix and Pradaxa.  He is doing well with this.  He has had absolutely no issues with cerebrovascular disease recurrence.  He is being followed closely by cardiology and neurology.  He is taking folic acid.  There is been no problems with headache.  He has had no leg pain or swelling.  He has had no cough or shortness of breath.  His ICD has not gone off.  Overall, his performance status is ECOG 0.  Medications:  Allergies as of 03/10/2019   No Known Allergies     Medication List       Accurate as of March 10, 2019 12:37 PM. If you have any questions, ask your nurse or doctor.        atorvastatin 80 MG tablet Commonly known as: LIPITOR TAKE 1 TABLET (80 MG TOTAL) BY MOUTH DAILY AT 6 PM.   clopidogrel 75 MG tablet Commonly known as: PLAVIX TAKE 1 TABLET BY MOUTH EVERY DAY   dabigatran 150 MG Caps capsule Commonly known as: Pradaxa Take 1 capsule (150 mg total) by mouth 2 (two) times daily.   Entresto 24-26 MG Generic drug: sacubitril-valsartan Take 1 tablet by mouth 2 (two) times daily.   ezetimibe 10 MG tablet Commonly known as: ZETIA TAKE 1 TABLET BY MOUTH EVERY DAY   folic acid 1 MG tablet Commonly known as: FOLVITE Take 1 tablet (1 mg total) by mouth daily.   Levetiracetam 750 MG Tb24 Take 1 tablet (750 mg total) by mouth 2 (two) times daily.   loperamide 2 MG capsule Commonly known as: IMODIUM Take 1 capsule (2 mg total) by mouth 4 (four) times daily as needed for diarrhea or loose  stools.   metoprolol succinate 50 MG 24 hr tablet Commonly known as: TOPROL-XL TAKE 1 TABLET (50 MG TOTAL) BY MOUTH DAILY. TAKE WITH OR IMMEDIATELY FOLLOWING A MEAL.   sildenafil 50 MG tablet Commonly known as: Viagra Take 1 tablet (50 mg total) by mouth daily as needed for erectile dysfunction.   traZODone 50 MG tablet Commonly known as: DESYREL Take 1 tablet (50 mg total) by mouth at bedtime.       Allergies: No Known Allergies  Past Medical History, Surgical history, Social history, and Family History were reviewed and updated.  Review of Systems: Review of Systems  Constitutional: Negative.   HENT: Negative.   Eyes: Negative.   Respiratory: Negative.   Cardiovascular: Negative.   Gastrointestinal: Negative.   Genitourinary: Negative.   Musculoskeletal: Negative.   Skin: Negative.   Neurological: Negative.   Endo/Heme/Allergies: Negative.   Psychiatric/Behavioral: Negative.       Physical Exam:  weight is 184 lb (83.5 kg). His temporal temperature is 97.3 F (36.3 C) (abnormal). His blood pressure is 130/99 (abnormal) and his pulse is 58 (abnormal). His respiration is 20 and oxygen saturation is 98%.   Wt Readings from Last 3 Encounters:  03/10/19 184 lb (83.5 kg)  11/02/18 177 lb (80.3 kg)  02/04/18 187 lb 8 oz (85  kg)    Physical Exam Vitals reviewed.  HENT:     Head: Normocephalic and atraumatic.  Eyes:     Pupils: Pupils are equal, round, and reactive to light.  Cardiovascular:     Rate and Rhythm: Normal rate and regular rhythm.     Heart sounds: Normal heart sounds.  Pulmonary:     Effort: Pulmonary effort is normal.     Breath sounds: Normal breath sounds.  Abdominal:     General: Bowel sounds are normal.     Palpations: Abdomen is soft.  Musculoskeletal:        General: No tenderness or deformity. Normal range of motion.     Cervical back: Normal range of motion.  Lymphadenopathy:     Cervical: No cervical adenopathy.  Skin:    General:  Skin is warm and dry.     Findings: No erythema or rash.  Neurological:     Mental Status: He is alert and oriented to person, place, and time.  Psychiatric:        Behavior: Behavior normal.        Thought Content: Thought content normal.        Judgment: Judgment normal.      Lab Results  Component Value Date   WBC 7.7 03/10/2019   HGB 16.3 03/10/2019   HCT 49.2 03/10/2019   MCV 89.8 03/10/2019   PLT 270 03/10/2019   No results found for: FERRITIN, IRON, TIBC, UIBC, IRONPCTSAT Lab Results  Component Value Date   RBC 5.48 03/10/2019   No results found for: KPAFRELGTCHN, LAMBDASER, KAPLAMBRATIO No results found for: IGGSERUM, IGA, IGMSERUM No results found for: Odetta Pink, SPEI   Chemistry      Component Value Date/Time   NA 145 03/10/2019 1056   NA 143 08/28/2017 0944   K 3.6 03/10/2019 1056   CL 106 03/10/2019 1056   CO2 32 03/10/2019 1056   BUN <5 (L) 03/10/2019 1056   BUN 7 08/28/2017 0944   CREATININE 1.03 03/10/2019 1056      Component Value Date/Time   CALCIUM 9.7 03/10/2019 1056   ALKPHOS 104 03/10/2019 1056   AST 29 03/10/2019 1056   ALT 38 03/10/2019 1056   BILITOT 1.0 03/10/2019 1056       Impression and Plan: Ms. Camper is a pleasant 49 yo caucasian gentleman with Factor V Leiden mutation, heterozygous, history of cerebrovascular CVA and hyper homocystinemia.   He continues to do well on daily Pradaxa, Plavix and folic acid.   At this point, I think we can probably move him to yearly.  He is being followed closely by other doctors.  He certainly knows that there is any problem that he can always come back to see [place].  I am just happy that his quality of life is doing well.    Volanda Napoleon, MD 3/3/202112:37 PM

## 2019-03-11 LAB — HOMOCYSTEINE: Homocysteine: 16.2 umol/L — ABNORMAL HIGH (ref 0.0–14.5)

## 2019-03-28 ENCOUNTER — Other Ambulatory Visit: Payer: Self-pay | Admitting: Cardiovascular Disease

## 2019-05-05 ENCOUNTER — Telehealth: Payer: Self-pay

## 2019-05-05 ENCOUNTER — Telehealth: Payer: Self-pay | Admitting: Cardiovascular Disease

## 2019-05-05 NOTE — Telephone Encounter (Signed)
New Message    Pt is calling about his device and said he contacted the manufacture who is sending him a new device and should receive it in 7-10 days.  He says he cannot send any transmissions until the new device is received     Please advise

## 2019-05-05 NOTE — Telephone Encounter (Signed)
LMOVM letting the pt know we did receive his message about his monitor. I left the device clinic number for him to call if he needs help when he receives his new monitor.

## 2019-05-05 NOTE — Telephone Encounter (Signed)
Spoke with patient to remind of missed remote transmission 

## 2019-05-20 ENCOUNTER — Ambulatory Visit (INDEPENDENT_AMBULATORY_CARE_PROVIDER_SITE_OTHER): Payer: BC Managed Care – PPO | Admitting: *Deleted

## 2019-05-20 DIAGNOSIS — I5042 Chronic combined systolic (congestive) and diastolic (congestive) heart failure: Secondary | ICD-10-CM

## 2019-05-20 DIAGNOSIS — I4901 Ventricular fibrillation: Secondary | ICD-10-CM

## 2019-05-20 LAB — CUP PACEART REMOTE DEVICE CHECK
Battery Remaining Longevity: 106 mo
Battery Voltage: 2.98 V
Brady Statistic RV Percent Paced: 0.06 %
Date Time Interrogation Session: 20210512190843
HighPow Impedance: 66 Ohm
Implantable Pulse Generator Implant Date: 20180410
Lead Channel Impedance Value: 361 Ohm
Lead Channel Impedance Value: 456 Ohm
Lead Channel Pacing Threshold Amplitude: 0.875 V
Lead Channel Pacing Threshold Pulse Width: 0.4 ms
Lead Channel Sensing Intrinsic Amplitude: 20 mV
Lead Channel Sensing Intrinsic Amplitude: 20 mV
Lead Channel Setting Pacing Amplitude: 1.75 V
Lead Channel Setting Pacing Pulse Width: 0.4 ms
Lead Channel Setting Sensing Sensitivity: 0.3 mV

## 2019-05-21 DIAGNOSIS — I4901 Ventricular fibrillation: Secondary | ICD-10-CM | POA: Diagnosis not present

## 2019-05-24 NOTE — Progress Notes (Signed)
Remote ICD transmission.   

## 2019-06-14 ENCOUNTER — Other Ambulatory Visit: Payer: Self-pay | Admitting: Cardiovascular Disease

## 2019-06-16 NOTE — Telephone Encounter (Signed)
53 M  83.5 kg SCr 1.03 (3/21) LOV Croitoru 10/2018   CrCl 103.6

## 2019-07-11 ENCOUNTER — Other Ambulatory Visit: Payer: Self-pay | Admitting: Cardiovascular Disease

## 2019-08-19 ENCOUNTER — Ambulatory Visit (INDEPENDENT_AMBULATORY_CARE_PROVIDER_SITE_OTHER): Payer: BC Managed Care – PPO | Admitting: *Deleted

## 2019-08-19 DIAGNOSIS — I4901 Ventricular fibrillation: Secondary | ICD-10-CM

## 2019-08-19 LAB — CUP PACEART REMOTE DEVICE CHECK
Battery Remaining Longevity: 103 mo
Battery Voltage: 2.97 V
Brady Statistic RV Percent Paced: 0.06 %
Date Time Interrogation Session: 20210812102707
HighPow Impedance: 68 Ohm
Implantable Pulse Generator Implant Date: 20180410
Lead Channel Impedance Value: 418 Ohm
Lead Channel Impedance Value: 513 Ohm
Lead Channel Pacing Threshold Amplitude: 0.75 V
Lead Channel Pacing Threshold Pulse Width: 0.4 ms
Lead Channel Sensing Intrinsic Amplitude: 17.75 mV
Lead Channel Sensing Intrinsic Amplitude: 17.75 mV
Lead Channel Setting Pacing Amplitude: 1.5 V
Lead Channel Setting Pacing Pulse Width: 0.4 ms
Lead Channel Setting Sensing Sensitivity: 0.3 mV

## 2019-08-23 NOTE — Progress Notes (Signed)
Remote ICD transmission.   

## 2019-08-26 DIAGNOSIS — G40209 Localization-related (focal) (partial) symptomatic epilepsy and epileptic syndromes with complex partial seizures, not intractable, without status epilepticus: Secondary | ICD-10-CM | POA: Diagnosis not present

## 2019-08-26 DIAGNOSIS — G4700 Insomnia, unspecified: Secondary | ICD-10-CM | POA: Diagnosis not present

## 2019-11-10 ENCOUNTER — Other Ambulatory Visit: Payer: Self-pay | Admitting: Cardiovascular Disease

## 2019-11-15 ENCOUNTER — Ambulatory Visit (INDEPENDENT_AMBULATORY_CARE_PROVIDER_SITE_OTHER): Payer: BC Managed Care – PPO | Admitting: Cardiovascular Disease

## 2019-11-15 ENCOUNTER — Encounter: Payer: Self-pay | Admitting: Cardiovascular Disease

## 2019-11-15 ENCOUNTER — Other Ambulatory Visit: Payer: Self-pay

## 2019-11-15 VITALS — BP 97/66 | HR 56 | Ht 74.0 in | Wt 182.0 lb

## 2019-11-15 DIAGNOSIS — I2510 Atherosclerotic heart disease of native coronary artery without angina pectoris: Secondary | ICD-10-CM

## 2019-11-15 DIAGNOSIS — I4901 Ventricular fibrillation: Secondary | ICD-10-CM

## 2019-11-15 DIAGNOSIS — I69359 Hemiplegia and hemiparesis following cerebral infarction affecting unspecified side: Secondary | ICD-10-CM

## 2019-11-15 DIAGNOSIS — Z95810 Presence of automatic (implantable) cardiac defibrillator: Secondary | ICD-10-CM

## 2019-11-15 DIAGNOSIS — N522 Drug-induced erectile dysfunction: Secondary | ICD-10-CM

## 2019-11-15 DIAGNOSIS — D6851 Activated protein C resistance: Secondary | ICD-10-CM | POA: Diagnosis not present

## 2019-11-15 DIAGNOSIS — I5042 Chronic combined systolic (congestive) and diastolic (congestive) heart failure: Secondary | ICD-10-CM

## 2019-11-15 DIAGNOSIS — E785 Hyperlipidemia, unspecified: Secondary | ICD-10-CM | POA: Diagnosis not present

## 2019-11-15 DIAGNOSIS — Z7901 Long term (current) use of anticoagulants: Secondary | ICD-10-CM

## 2019-11-15 LAB — LIPID PANEL
Chol/HDL Ratio: 3.3 ratio (ref 0.0–5.0)
Cholesterol, Total: 113 mg/dL (ref 100–199)
HDL: 34 mg/dL — ABNORMAL LOW (ref >39)
LDL Chol Calc (NIH): 63 mg/dL (ref 0–99)
Triglycerides: 82 mg/dL (ref 0–149)
VLDL Cholesterol Cal: 16 mg/dL (ref 5–40)

## 2019-11-15 LAB — COMPREHENSIVE METABOLIC PANEL
ALT: 27 IU/L (ref 0–44)
AST: 23 IU/L (ref 0–40)
Albumin/Globulin Ratio: 2.4 — ABNORMAL HIGH (ref 1.2–2.2)
Albumin: 4.8 g/dL (ref 4.0–5.0)
Alkaline Phosphatase: 133 IU/L — ABNORMAL HIGH (ref 44–121)
BUN/Creatinine Ratio: 7 — ABNORMAL LOW (ref 9–20)
BUN: 7 mg/dL (ref 6–24)
Bilirubin Total: 0.7 mg/dL (ref 0.0–1.2)
CO2: 26 mmol/L (ref 20–29)
Calcium: 10.2 mg/dL (ref 8.7–10.2)
Chloride: 106 mmol/L (ref 96–106)
Creatinine, Ser: 1.02 mg/dL (ref 0.76–1.27)
GFR calc Af Amer: 99 mL/min/{1.73_m2} (ref >59)
GFR calc non Af Amer: 86 mL/min/{1.73_m2} (ref >59)
Globulin, Total: 2 g/dL (ref 1.5–4.5)
Glucose: 80 mg/dL (ref 65–99)
Potassium: 4.5 mmol/L (ref 3.5–5.2)
Sodium: 145 mmol/L — ABNORMAL HIGH (ref 134–144)
Total Protein: 6.8 g/dL (ref 6.0–8.5)

## 2019-11-15 MED ORDER — SILDENAFIL CITRATE 50 MG PO TABS: 50.0000 mg | ORAL_TABLET | Freq: Every day | ORAL | 3 refills | Status: DC | PRN

## 2019-11-15 NOTE — Progress Notes (Signed)
Cardiology Office Note:    Date:  11/16/2019   ID:  Bufford Lozano, DOB [birth year], MRN [id number]  PCP:  Patient, No Pcp Per  Cardiologist:  Vashti Bolanos  Referring MD: No ref. provider found   Chief Complaint  Patient presents with  . Coronary Artery Disease  . Congestive Heart Failure  . Pacemaker Check    ICD    History of Present Illness:    Roy Lozano is a 49 y.o. male with a hx of previous myocardial infarction (suspect anterior STEMI), history of cardiac arrest in April 2018, followed by implantation of a single-chamber defibrillator with 2 subsequent appropriate defibrillator discharges for ventricular fibrillation, most recently in January 2019.  (while living in [place]).    The patient specifically denies any chest pain at rest exertion, dyspnea at rest or with exertion, orthopnea, paroxysmal nocturnal dyspnea, syncope, palpitations, focal neurological deficits, intermittent claudication, lower extremity edema, unexplained weight gain, cough, hemoptysis or wheezing.  Sildenafil has worked well for his problems with erectile dysfunction.  He denies dizziness or lightheadedness.  He reports that taking ezetimibe sometimes makes his bowels very loose so he avoids taking this on days when he leaves the house.  He estimates that he takes at 90% of the time.  Interrogation of his defibrillator shows normal findings.  He has 8.2 years of remaining generator longevity and lead parameters are excellent.  He never requires ventricular pacing.  He is quite sedentary with only 0.7 hours of activity daily.  His OptiVol has not exceeded threshold in the last almost 12 months..  He is not had any episodes of VT or VF.    Interrogation of the device shows that he has delivered 2 shocks.  1 of them was in November 2018 and the details have been deleted from the device memory.  The other one occurred on February 04 2017 at 0351 hrs.  He had abrupt onset of what appears to be polymorphic  VT/VF with a cycle length varying between 130 and 190 ms and appropriate single defibrillator discharge at 36 J to convert the rhythm to ventricular pacing.  He was unaware of the discharge which occurred in the middle of the night.  He was warned of its occurrence due to the device alarms.  Unfortunately, Lozano also has some mild-moderate memory issues since his cardiac arrest and has not always available to provide details about his medical problems.  He is clearly a very intelligent and educated gentleman, he is here alone today.  He reports that his cardiac arrest occurred during hospitalization for pneumonia in April 2018.   It is not clear about the sequence of events but he had a large heart attack of the front wall of his heart.  He received stents.  He has also had a stroke which has left him with mild left-sided hemiparesis.  He states that his left hand is always cold.  He has limited dexterity and ability to grasp without limb and is applying for disability based on this.  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  He is now living with his 61 year old mother.  In addition to his cardiac problem has a history of epilepsy and factor V Leiden deficiency and hyper homocystinemia.  He reports having had a previous stroke at age 12.  His seizure disorder took a long time to diagnose accurately and treat, but is currently well controlled with antiepileptic medications.  He has a history of hypertension and hyperlipidemia.  He smoked very briefly and  in very small amounts: roughly a pack every 2 weeks for 1-1/2 years, quit 2018.  His list of medications includes clopidogrel and Pradaxa as well as a beta-blocker and low dose angiotensin receptor blocker.  He has been prescribed high-dose atorvastatin.  Past Medical History:  Diagnosis Date  . AMI (acute myocardial infarction) ([place])   . Arrhythmia   . Cardiac arrest ([place])   . Clotting disorder ([place])    factor 5 Leiden   . Coronary artery disease   .  CVA (cerebral vascular accident) ([place])   . Epilepsy ([place])   . Heart disease   . Hyperlipidemia   . Hypertension   . IBS (irritable bowel syndrome)   . Seizures ([place])   . Stroke ([place])   . Ulcerative colitis [facility])     Past Surgical History:  Procedure Laterality Date  . CARDIAC CATHETERIZATION    . CARDIAC DEFIBRILLATOR PLACEMENT    . ICD IMPLANT Left 04/16/2016   [employer]    Current Medications:  Current Outpatient Medications on File Prior to Visit  Medication Sig Dispense Refill  . atorvastatin (LIPITOR) 80 MG tablet TAKE 1 TABLET (80 MG TOTAL) BY MOUTH DAILY AT 6 PM. 90 tablet 3  . clopidogrel (PLAVIX) 75 MG tablet TAKE 1 TABLET BY MOUTH EVERY DAY 90 tablet 2  . dabigatran (PRADAXA) 150 MG CAPS capsule Take 1 capsule (150 mg total) by mouth 2 (two) times daily. 180 capsule 1  . ENTRESTO 24-26 MG TAKE 1 TABLET BY MOUTH TWICE A DAY 180 tablet 2  . ezetimibe (ZETIA) 10 MG tablet TAKE 1 TABLET BY MOUTH EVERY DAY 90 tablet 2  . folic acid (FOLVITE) 1 MG tablet Take 1 tablet (1 mg total) by mouth daily. 90 tablet 6  . Levetiracetam 750 MG TB24 Take 1 tablet (750 mg total) by mouth 2 (two) times daily. 60 tablet 0  . loperamide (IMODIUM) 2 MG capsule Take 1 capsule (2 mg total) by mouth 4 (four) times daily as needed for diarrhea or loose stools. 6 capsule 0  . metoprolol succinate (TOPROL-XL) 50 MG 24 hr tablet TAKE 1 TABLET (50 MG TOTAL) BY MOUTH DAILY. TAKE WITH OR IMMEDIATELY FOLLOWING A MEAL. 90 tablet 3  . traZODone (DESYREL) 50 MG tablet Take 1 tablet (50 mg total) by mouth at bedtime. 30 tablet 4   No current facility-administered medications on file prior to visit.      Allergies:   Patient has no known allergies.   Social History   Socioeconomic History  . Marital status: Single    Spouse name: Not on file  . Number of children: Not on file  . Years of education: Not on file  . Highest education level: Not on file  Occupational History  . Not on file  Tobacco  Use  . Smoking status: Former Smoker    Types: Cigarettes    Start date: 03/03/2016  . Smokeless tobacco: Never Used  Vaping Use  . Vaping Use: Never used  Substance and Sexual Activity  . Alcohol use: Yes    Comment: rarely  . Drug use: No  . Sexual activity: Yes  Other Topics Concern  . Not on file  Social History Narrative   ** Merged History Encounter **       Social Determinants of Health   Financial Resource Strain:   . Difficulty of Paying Living Expenses: Not on file  Food Insecurity:   . Worried About Charity fundraiser in the Last Year:  Not on file  . Ran Out of Food in the Last Year: Not on file  Transportation Needs:   . Lack of Transportation (Medical): Not on file  . Lack of Transportation (Non-Medical): Not on file  Physical Activity:   . Days of Exercise per Week: Not on file  . Minutes of Exercise per Session: Not on file  Stress:   . Feeling of Stress : Not on file  Social Connections:   . Frequency of Communication with Friends and Family: Not on file  . Frequency of Social Gatherings with Friends and Family: Not on file  . Attends Religious Services: Not on file  . Active Member of Clubs or Organizations: Not on file  . Attends Archivist Meetings: Not on file  . Marital Status: Not on file     Family History: The patient's family history includes Heart attack in his father; Heart disease in his paternal grandfather and paternal grandmother; Stroke in his maternal grandfather and maternal grandmother.  ROS:   Please see the history of present illness.  All other systems are reviewed and are negative.  EKGs/Labs/Other Studies Reviewed:    The following studies were reviewed today: ICD check  EKG:  EKG isordered today.  It shows sinus bradycardia at 56 bpm with QS pattern in leads V1-V2, normal QT interval 414 ms.  He has nonspecific T wave changes in leads V5-V6, 1 and aVL.  Recent Labs: 03/10/2019: Hemoglobin 16.3; Platelet Count  270 11/15/2019: ALT 27; BUN 7; Creatinine, Ser 1.02; Potassium 4.5; Sodium 145  Recent Lipid Panel    Component Value Date/Time   CHOL 113 11/15/2019 1015   TRIG 82 11/15/2019 1015   HDL 34 (L) 11/15/2019 1015   CHOLHDL 3.3 11/15/2019 1015   LDLCALC 63 11/15/2019 1015    Physical Exam:    VS:  BP 97/66   Pulse (!) 56   Ht 6' 2"  (1.88 m)   Wt 182 lb (82.6 kg)   SpO2 97%   BMI 23.37 kg/m     Wt Readings from Last 3 Encounters:  11/15/19 182 lb (82.6 kg)  03/10/19 184 lb (83.5 kg)  11/02/18 177 lb (80.3 kg)      General: Alert, oriented x3, no distress, healthy left subclavian ICD site Head: no evidence of trauma, PERRL, EOMI, no exophtalmos or lid lag, no myxedema, no xanthelasma; normal ears, nose and oropharynx Neck: normal jugular venous pulsations and no hepatojugular reflux; brisk carotid pulses without delay and no carotid bruits Chest: clear to auscultation, no signs of consolidation by percussion or palpation, normal fremitus, symmetrical and full respiratory excursions Cardiovascular: normal position and quality of the apical impulse, regular rhythm, normal first and second heart sounds, no murmurs, rubs or gallops Abdomen: no tenderness or distention, no masses by palpation, no abnormal pulsatility or arterial bruits, normal bowel sounds, no hepatosplenomegaly Extremities: no clubbing, cyanosis or edema; 2+ radial, ulnar and brachial pulses bilaterally; 2+ right femoral, posterior tibial and dorsalis pedis pulses; 2+ left femoral, posterior tibial and dorsalis pedis pulses; no subclavian or femoral bruits Neurological: Very mild 4/5 weakness in the left upper extremity and slight ataxia on the left side. Psych: Normal mood and affect  ASSESSMENT:    1. VF (ventricular fibrillation) ([place])   2. Coronary artery disease involving native coronary artery of native heart without angina pectoris   3. Chronic combined systolic and diastolic heart failure ([place])   4. Factor V  Leiden ([place])   5. Long term (  current) use of anticoagulants   6. Hemiparesis due to old stroke ([place])   7. Dyslipidemia (high LDL; low HDL)   8. ICD (implantable cardioverter-defibrillator) in place   9. Drug-induced erectile dysfunction    PLAN:    In order of problems listed above:  1. VF: No recent episodes of high ventricular rate on his device. 2. CAD: He does not have angina pectoris.  He is also quite sedentary. He is on clopidogrel and statin therapy as well as beta-blockers.  We do not have the details of his coronary anatomy at his cardiac catheterization in [place].  No bleeding complications on antiplatelet and anticoagulant therapy. The wall motion abnormalities on echo were consistent with infarction in the territory of a large "wraparound" LAD artery (but could also be consistent with Takotsubo syndrome, although this should have been reversible). 3. CHF: Euvolemic without diuretics, good NYHA functional class I status.  OptiVol has been within normal range for about a year now.  Most recent LVEF estimated at 35-40%.  He would not tolerate higher doses of Entresto or metoprolol due to his bradycardia and hypotension.. 4. Factor V Leiden and hyper homocystinemia: Followed by Dr. Marin Olp.  He is in agreement with the choice of oral anticoagulant and feels that his procoagulant condition has been important contributor to his stroke at age 80. 93. Pradaxa: No bleeding complications. 6. L hemiparesis: Loss of dexterity impairs his ability to use devices such as keyboard and computer mouse. Together with his memory impairment, this will make it very challenging to have a job. 7. HLP: Lipid profile checked today shows excellent total cholesterol and LDL cholesterol and a chronically low HDL which is likely inherited.  He is relatively lean.  Physical activity would help. 8. ICD: Normal device function.  Continue remote downloads every 3 months.  Has had 2 appropriate shocks for  ventricular fibrillation more than 2 years  ago.  On beta-blockers.   9. ED: Doing well with sildenafil.  Reminded him never to combine this with nitrates within 24 hours of each other.  Medication Adjustments/Labs and Tests Ordered: Current medicines are reviewed at length with the patient today.  Concerns regarding medicines are outlined above.  Orders Placed This Encounter  Procedures  . Lipid panel  . Comprehensive metabolic panel  . EKG 12-Lead   Meds ordered this encounter  Medications  . sildenafil (VIAGRA) 50 MG tablet    Sig: Take 1 tablet (50 mg total) by mouth daily as needed for erectile dysfunction.    Dispense:  10 tablet    Refill:  3    Signed, Sanda Klein, MD  11/16/2019 6:16 PM    [facility]

## 2019-11-15 NOTE — Patient Instructions (Signed)
Medication Instructions:  No changes *If you need a refill on your cardiac medications before your next appointment, please call your pharmacy*   Lab Work: Your provider would like for you to have the following labs today: CMET and Lipid  If you have labs (blood work) drawn today and your tests are completely normal, you will receive your results only by: Marland Kitchen MyChart Message (if you have MyChart) OR . A paper copy in the mail If you have any lab test that is abnormal or we need to change your treatment, we will call you to review the results.   Testing/Procedures: None ordered   Follow-Up: At [facility], you and your health needs are our priority.  As part of our continuing mission to provide you with exceptional heart care, we have created designated Provider Care Teams.  These Care Teams include your primary Cardiologist (physician) and Advanced Practice Providers (APPs -  Physician Assistants and Nurse Practitioners) who all work together to provide you with the care you need, when you need it.  We recommend signing up for the patient portal called "MyChart".  Sign up information is provided on this After Visit Summary.  MyChart is used to connect with patients for Virtual Visits (Telemedicine).  Patients are able to view lab/test results, encounter notes, upcoming appointments, etc.  Non-urgent messages can be sent to your provider as well.   To learn more about what you can do with MyChart, go to [email or web address].    Your next appointment:   12 month(s)  The format for your next appointment:   In Person  Provider:   Sanda Klein, MD

## 2019-11-16 ENCOUNTER — Encounter: Payer: Self-pay | Admitting: Cardiovascular Disease

## 2019-11-18 ENCOUNTER — Ambulatory Visit (INDEPENDENT_AMBULATORY_CARE_PROVIDER_SITE_OTHER): Payer: BC Managed Care – PPO

## 2019-11-18 ENCOUNTER — Encounter: Payer: Self-pay | Admitting: *Deleted

## 2019-11-18 DIAGNOSIS — I4901 Ventricular fibrillation: Secondary | ICD-10-CM | POA: Diagnosis not present

## 2019-11-19 ENCOUNTER — Telehealth: Payer: Self-pay | Admitting: Cardiovascular Disease

## 2019-11-19 NOTE — Telephone Encounter (Signed)
Tried to call pt and left detailed mess that Dr C is out of the office but will forward message.

## 2019-11-19 NOTE — Telephone Encounter (Signed)
Message:     Pt wants to know if Dr C thinks he qualifies for Disability? If he does, he would like a letter of recommendation from him please.

## 2019-11-20 LAB — CUP PACEART REMOTE DEVICE CHECK
Battery Remaining Longevity: 98 mo
Battery Voltage: 2.95 V
Brady Statistic RV Percent Paced: 0.01 %
Date Time Interrogation Session: 20211112131137
HighPow Impedance: 65 Ohm
Implantable Pulse Generator Implant Date: 20180410
Lead Channel Impedance Value: 399 Ohm
Lead Channel Impedance Value: 513 Ohm
Lead Channel Pacing Threshold Amplitude: 0.625 V
Lead Channel Pacing Threshold Pulse Width: 0.4 ms
Lead Channel Sensing Intrinsic Amplitude: 18.625 mV
Lead Channel Sensing Intrinsic Amplitude: 18.625 mV
Lead Channel Setting Pacing Amplitude: 1.5 V
Lead Channel Setting Pacing Pulse Width: 0.4 ms
Lead Channel Setting Sensing Sensitivity: 0.3 mV

## 2019-11-21 NOTE — Telephone Encounter (Signed)
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  I think his neurological issues are much more likely to be significant if he plans an application for disability.

## 2019-11-22 NOTE — Progress Notes (Signed)
Remote ICD transmission.   

## 2019-11-23 NOTE — Telephone Encounter (Signed)
Left a message for the patient to call back.  

## 2019-11-26 NOTE — Telephone Encounter (Signed)
Left a message for the patient to call back.  

## 2019-11-29 ENCOUNTER — Other Ambulatory Visit: Payer: Self-pay | Admitting: Cardiovascular Disease

## 2019-11-29 NOTE — Telephone Encounter (Signed)
Left a message for the patient to call back.  Encounter will be closed.

## 2019-11-29 NOTE — Telephone Encounter (Signed)
Prescription refill request for Pradaxa received.  Indication: cva Last office visit: 11/2019 [place] Weight:82.6 kg Age: 50 Scr: 1.02 11/2019 CrCl: 102.35 ml/min  Prescription refilled

## 2020-01-29 ENCOUNTER — Other Ambulatory Visit: Payer: Self-pay | Admitting: Cardiovascular Disease

## 2020-02-17 ENCOUNTER — Ambulatory Visit (INDEPENDENT_AMBULATORY_CARE_PROVIDER_SITE_OTHER): Payer: BC Managed Care – PPO

## 2020-02-17 DIAGNOSIS — I4901 Ventricular fibrillation: Secondary | ICD-10-CM | POA: Diagnosis not present

## 2020-02-20 LAB — CUP PACEART REMOTE DEVICE CHECK
Battery Remaining Longevity: 92 mo
Battery Voltage: 2.97 V
Brady Statistic RV Percent Paced: 0.01 %
Date Time Interrogation Session: 20220212112606
HighPow Impedance: 78 Ohm
Implantable Pulse Generator Implant Date: 20180410
Lead Channel Impedance Value: 418 Ohm
Lead Channel Impedance Value: 475 Ohm
Lead Channel Pacing Threshold Amplitude: 0.625 V
Lead Channel Pacing Threshold Pulse Width: 0.4 ms
Lead Channel Sensing Intrinsic Amplitude: 20.5 mV
Lead Channel Sensing Intrinsic Amplitude: 20.5 mV
Lead Channel Setting Pacing Amplitude: 1.5 V
Lead Channel Setting Pacing Pulse Width: 0.4 ms
Lead Channel Setting Sensing Sensitivity: 0.3 mV

## 2020-02-23 NOTE — Progress Notes (Signed)
Remote ICD transmission.   

## 2020-03-09 ENCOUNTER — Inpatient Hospital Stay: Payer: BC Managed Care – PPO

## 2020-03-09 ENCOUNTER — Inpatient Hospital Stay: Payer: BC Managed Care – PPO | Admitting: Hematology & Oncology

## 2020-04-07 ENCOUNTER — Other Ambulatory Visit: Payer: Self-pay

## 2020-04-07 DIAGNOSIS — D6851 Activated protein C resistance: Secondary | ICD-10-CM

## 2020-04-10 ENCOUNTER — Telehealth: Payer: Self-pay | Admitting: Hematology & Oncology

## 2020-04-10 ENCOUNTER — Inpatient Hospital Stay: Payer: BC Managed Care – PPO | Attending: Hematology & Oncology

## 2020-04-10 ENCOUNTER — Inpatient Hospital Stay: Payer: BC Managed Care – PPO | Attending: Hematology & Oncology | Admitting: Hematology & Oncology

## 2020-04-10 ENCOUNTER — Encounter: Payer: Self-pay | Admitting: Hematology & Oncology

## 2020-04-10 ENCOUNTER — Other Ambulatory Visit: Payer: Self-pay

## 2020-04-10 VITALS — BP 113/94 | HR 88 | Temp 98.1°F | Resp 18 | Ht 74.0 in | Wt 189.0 lb

## 2020-04-10 DIAGNOSIS — I2510 Atherosclerotic heart disease of native coronary artery without angina pectoris: Secondary | ICD-10-CM | POA: Insufficient documentation

## 2020-04-10 DIAGNOSIS — R7983 Abnormal findings of blood amino-acid level: Secondary | ICD-10-CM | POA: Insufficient documentation

## 2020-04-10 DIAGNOSIS — D6851 Activated protein C resistance: Secondary | ICD-10-CM | POA: Insufficient documentation

## 2020-04-10 LAB — CBC WITH DIFFERENTIAL (CANCER CENTER ONLY)
Abs Immature Granulocytes: 0.02 10*3/uL (ref 0.00–0.07)
Basophils Absolute: 0 10*3/uL (ref 0.0–0.1)
Basophils Relative: 1 %
Eosinophils Absolute: 0.5 10*3/uL (ref 0.0–0.5)
Eosinophils Relative: 7 %
HCT: 48.6 % (ref 39.0–52.0)
Hemoglobin: 16.3 g/dL (ref 13.0–17.0)
Immature Granulocytes: 0 %
Lymphocytes Relative: 28 %
Lymphs Abs: 2.3 10*3/uL (ref 0.7–4.0)
MCH: 30.4 pg (ref 26.0–34.0)
MCHC: 33.5 g/dL (ref 30.0–36.0)
MCV: 90.7 fL (ref 80.0–100.0)
Monocytes Absolute: 0.7 10*3/uL (ref 0.1–1.0)
Monocytes Relative: 9 %
Neutro Abs: 4.6 10*3/uL (ref 1.7–7.7)
Neutrophils Relative %: 55 %
Platelet Count: 304 10*3/uL (ref 150–400)
RBC: 5.36 MIL/uL (ref 4.22–5.81)
RDW: 12.6 % (ref 11.5–15.5)
WBC Count: 8.2 10*3/uL (ref 4.0–10.5)
nRBC: 0 % (ref 0.0–0.2)

## 2020-04-10 LAB — CMP (CANCER CENTER ONLY)
ALT: 39 U/L (ref 0–44)
AST: 27 U/L (ref 15–41)
Albumin: 4.6 g/dL (ref 3.5–5.0)
Alkaline Phosphatase: 125 U/L (ref 38–126)
Anion gap: 7 (ref 5–15)
BUN: 11 mg/dL (ref 6–20)
CO2: 30 mmol/L (ref 22–32)
Calcium: 9.9 mg/dL (ref 8.9–10.3)
Chloride: 105 mmol/L (ref 98–111)
Creatinine: 1.05 mg/dL (ref 0.61–1.24)
GFR, Estimated: >60 mL/min (ref >60)
Glucose, Bld: 87 mg/dL (ref 70–99)
Potassium: 3.9 mmol/L (ref 3.5–5.1)
Sodium: 142 mmol/L (ref 135–145)
Total Bilirubin: 0.8 mg/dL (ref 0.3–1.2)
Total Protein: 7.2 g/dL (ref 6.5–8.1)

## 2020-04-10 LAB — D-DIMER, QUANTITATIVE: D-Dimer, Quant: <0.27 ug/mL-FEU (ref 0.00–0.50)

## 2020-04-10 NOTE — Progress Notes (Signed)
Hematology and Oncology Follow Up Visit  Roy Lozano [id number] Jan 09, 1970 50 y.o. 04/10/2020   Principle Diagnosis:  Factor V Leiden mutation -heterozygous Cerebrovascular disease-CVA Hyper homocystinemia  Current Therapy:   Plavix 75 mg p.o. daily Pradaxa 150 mg p.o. twice daily Folic acid 1 mg p.o. daily   Interim History:  Roy Lozano is here today for follow-up.  We see him every year.  So far, he is had no problems since we last saw him.  He has had no problems with bleeding or bruising.  He has had no issues with cerebrovascular disease.  He has had no cardiac issues.  He does have a implantable defibrillator.  There is been no problems with Covid.  Has had no issues with bowels or bladder.  He has had no cough.  There is no nausea or vomiting.  Overall, his performance status is ECOG 0.    Medications:  Allergies as of 04/10/2020   No Known Allergies     Medication List       Accurate as of April 10, 2020  2:25 PM. If you have any questions, ask your nurse or doctor.        atorvastatin 80 MG tablet Commonly known as: LIPITOR TAKE 1 TABLET (80 MG TOTAL) BY MOUTH DAILY AT 6 PM.   clopidogrel 75 MG tablet Commonly known as: PLAVIX TAKE 1 TABLET BY MOUTH EVERY DAY   Entresto 24-26 MG Generic drug: sacubitril-valsartan TAKE 1 TABLET BY MOUTH TWICE A DAY   ezetimibe 10 MG tablet Commonly known as: ZETIA TAKE 1 TABLET BY MOUTH EVERY DAY   folic acid 1 MG tablet Commonly known as: FOLVITE Take 1 tablet (1 mg total) by mouth daily.   Levetiracetam 750 MG Tb24 Take 1 tablet (750 mg total) by mouth 2 (two) times daily.   loperamide 2 MG capsule Commonly known as: IMODIUM Take 1 capsule (2 mg total) by mouth 4 (four) times daily as needed for diarrhea or loose stools.   metoprolol succinate 50 MG 24 hr tablet Commonly known as: TOPROL-XL TAKE 1 TABLET (50 MG TOTAL) BY MOUTH DAILY. TAKE WITH OR IMMEDIATELY FOLLOWING A MEAL.   Pradaxa 150 MG Caps  capsule Generic drug: dabigatran TAKE 1 CAPSULE BY MOUTH TWICE A DAY   sildenafil 50 MG tablet Commonly known as: Viagra Take 1 tablet (50 mg total) by mouth daily as needed for erectile dysfunction.   traZODone 50 MG tablet Commonly known as: DESYREL Take 1 tablet (50 mg total) by mouth at bedtime.       Allergies: No Known Allergies  Past Medical History, Surgical history, Social history, and Family History were reviewed and updated.  Review of Systems: Review of Systems  Constitutional: Negative.   HENT: Negative.   Eyes: Negative.   Respiratory: Negative.   Cardiovascular: Negative.   Gastrointestinal: Negative.   Genitourinary: Negative.   Musculoskeletal: Negative.   Skin: Negative.   Neurological: Negative.   Endo/Heme/Allergies: Negative.   Psychiatric/Behavioral: Negative.       Physical Exam:  height is 6' 2"  (1.88 m) and weight is 189 lb (85.7 kg). His oral temperature is 98.1 F (36.7 C). His blood pressure is 113/94 (abnormal) and his pulse is 88. His respiration is 18 and oxygen saturation is 99%.   Wt Readings from Last 3 Encounters:  04/10/20 189 lb (85.7 kg)  11/15/19 182 lb (82.6 kg)  03/10/19 184 lb (83.5 kg)    Physical Exam Vitals reviewed.  HENT:  Head: Normocephalic and atraumatic.  Eyes:     Pupils: Pupils are equal, round, and reactive to light.  Cardiovascular:     Rate and Rhythm: Normal rate and regular rhythm.     Heart sounds: Normal heart sounds.  Pulmonary:     Effort: Pulmonary effort is normal.     Breath sounds: Normal breath sounds.  Abdominal:     General: Bowel sounds are normal.     Palpations: Abdomen is soft.  Musculoskeletal:        General: No tenderness or deformity. Normal range of motion.     Cervical back: Normal range of motion.  Lymphadenopathy:     Cervical: No cervical adenopathy.  Skin:    General: Skin is warm and dry.     Findings: No erythema or rash.  Neurological:     Mental Status: He is  alert and oriented to person, place, and time.  Psychiatric:        Behavior: Behavior normal.        Thought Content: Thought content normal.        Judgment: Judgment normal.      Lab Results  Component Value Date   WBC 8.2 04/10/2020   HGB 16.3 04/10/2020   HCT 48.6 04/10/2020   MCV 90.7 04/10/2020   PLT 304 04/10/2020   No results found for: FERRITIN, IRON, TIBC, UIBC, IRONPCTSAT Lab Results  Component Value Date   RBC 5.36 04/10/2020   No results found for: KPAFRELGTCHN, LAMBDASER, KAPLAMBRATIO No results found for: IGGSERUM, IGA, IGMSERUM No results found for: Odetta Pink, SPEI   Chemistry      Component Value Date/Time   NA 142 04/10/2020 1341   NA 145 (H) 11/15/2019 1015   K 3.9 04/10/2020 1341   CL 105 04/10/2020 1341   CO2 30 04/10/2020 1341   BUN 11 04/10/2020 1341   BUN 7 11/15/2019 1015   CREATININE 1.05 04/10/2020 1341      Component Value Date/Time   CALCIUM 9.9 04/10/2020 1341   ALKPHOS 125 04/10/2020 1341   AST 27 04/10/2020 1341   ALT 39 04/10/2020 1341   BILITOT 0.8 04/10/2020 1341       Impression and Plan: Roy Lozano is a pleasant 50 yo caucasian gentleman with Factor V Leiden mutation, heterozygous, history of cerebrovascular CVA and hyper homocystinemia.   He continues to do well on daily Pradaxa, Plavix and folic acid.   I do not see any problems with respect to hypercoagulability right now.  I think yearly follow-up is perfect for him.  He certainly has no problems with this.  He can always let us know if there are issues.   Roy Napoleon, MD 4/4/20222:25 PM

## 2020-04-11 LAB — HOMOCYSTEINE: Homocysteine: 8.4 umol/L (ref 0.0–14.5)

## 2020-04-29 ENCOUNTER — Other Ambulatory Visit: Payer: Self-pay | Admitting: Cardiovascular Disease

## 2020-05-01 NOTE — Telephone Encounter (Signed)
5m 85.7kg, scr 1.05 04/10/20, lovw/croitoru 11/15/19, ccr 103.2

## 2020-05-13 ENCOUNTER — Emergency Department (HOSPITAL_BASED_OUTPATIENT_CLINIC_OR_DEPARTMENT_OTHER): Payer: BC Managed Care – PPO

## 2020-05-13 ENCOUNTER — Encounter (HOSPITAL_BASED_OUTPATIENT_CLINIC_OR_DEPARTMENT_OTHER): Payer: Self-pay

## 2020-05-13 ENCOUNTER — Other Ambulatory Visit: Payer: Self-pay

## 2020-05-13 ENCOUNTER — Emergency Department (HOSPITAL_BASED_OUTPATIENT_CLINIC_OR_DEPARTMENT_OTHER)
Admission: EM | Admit: 2020-05-13 | Discharge: 2020-05-13 | Disposition: A | Discharge status: Home / Self Care | Payer: BC Managed Care – PPO | Attending: Emergency Medicine | Admitting: Emergency Medicine

## 2020-05-13 DIAGNOSIS — M25551 Pain in right hip: Secondary | ICD-10-CM | POA: Diagnosis not present

## 2020-05-13 DIAGNOSIS — I5042 Chronic combined systolic (congestive) and diastolic (congestive) heart failure: Secondary | ICD-10-CM | POA: Insufficient documentation

## 2020-05-13 DIAGNOSIS — Z7901 Long term (current) use of anticoagulants: Secondary | ICD-10-CM | POA: Diagnosis not present

## 2020-05-13 DIAGNOSIS — Z79899 Other long term (current) drug therapy: Secondary | ICD-10-CM | POA: Insufficient documentation

## 2020-05-13 DIAGNOSIS — I110 Hypertensive heart disease with heart failure: Secondary | ICD-10-CM | POA: Diagnosis not present

## 2020-05-13 DIAGNOSIS — Z87891 Personal history of nicotine dependence: Secondary | ICD-10-CM | POA: Diagnosis not present

## 2020-05-13 DIAGNOSIS — I2510 Atherosclerotic heart disease of native coronary artery without angina pectoris: Secondary | ICD-10-CM | POA: Diagnosis not present

## 2020-05-13 DIAGNOSIS — M79604 Pain in right leg: Secondary | ICD-10-CM | POA: Diagnosis not present

## 2020-05-13 DIAGNOSIS — M79606 Pain in leg, unspecified: Secondary | ICD-10-CM

## 2020-05-13 MED ORDER — OXYCODONE-ACETAMINOPHEN 5-325 MG PO TABS
1.0000 | ORAL_TABLET | Freq: Once | ORAL | Status: AC
Start: 2020-05-13 — End: 2020-05-13
  Administered 2020-05-13: 1 via ORAL
  Filled 2020-05-13: qty 1

## 2020-05-13 MED ORDER — OXYCODONE-ACETAMINOPHEN 5-325 MG PO TABS: 1.0000 | ORAL_TABLET | Freq: Three times a day (TID) | ORAL | 0 refills | Status: AC | PRN

## 2020-05-13 NOTE — Discharge Instructions (Addendum)
I have prescribed you a strong narcotic called Percocet. Please only take this as prescribed. This medication also has tylenol in it, so please be sure you are not taking more than 3000 mg of tylenol per day. Do not drive or operate heavy machinery after taking this medication. Do not mix it with alcohol.   Please continue to closely monitor your symptoms.  If you find they worsen, please come back to the emergency department for reevaluation.  If you find your symptoms persist, I have given you a referral to a sports medicine doctor named Dr. Raeford Razor.  He is located in the same building as this emergency department.  Please give him a call next week if you find that your symptoms are not improving.  It was a pleasure to meet you.

## 2020-05-13 NOTE — ED Provider Notes (Signed)
[facility] EMERGENCY DEPARTMENT Provider Note   CSN: [id number] Arrival date & time: 05/13/20  1301     History Chief Complaint  Patient presents with  . Leg Pain    Pain shotting down right leg x 1 week    Roy Lozano is a 50 y.o. male.  HPI   Patient is a 50 year old male with a history of factor V Leiden, CAD, CVA, epilepsy, hypertension, ulcerative colitis, who presents the emergency department due to atraumatic right hip and leg pain.  He states his symptoms started about 6 days ago.  They have been progressively worsening.  He states his pain is sharp and stabbing and radiates from the right lateral hip down the right leg along the posterior aspect and lateral aspect of the leg.  No numbness or leg swelling.  No chest pain or shortness of breath.  Reports an episode of nausea and vomiting 2 days ago but denies any current or recent nausea and vomiting.  He states that he does not ambulate with a cane at baseline but due to the worsening pain he is now having to use a cane for assistance.  No fevers, URI symptoms, urinary complaints.  He is on both Pradaxa as well as Plavix.  He states he has been compliant with his medications.     Past Medical History:  Diagnosis Date  . AMI (acute myocardial infarction) ([place])   . Arrhythmia   . Cardiac arrest ([place])   . Clotting disorder ([place])    factor 5 Leiden   . Coronary artery disease   . CVA (cerebral vascular accident) ([place])   . Epilepsy ([place])   . Heart disease   . Hyperlipidemia   . Hypertension   . IBS (irritable bowel syndrome)   . Seizures ([place])   . Stroke ([place])    2018  . Ulcerative colitis [facility])     Patient Active Problem List   Diagnosis Date Noted  . Coronary artery disease involving native coronary artery of native heart without angina pectoris 03/04/2017  . Chronic combined systolic and diastolic heart failure ([place]) 03/04/2017  . Factor V Leiden ([place]) 03/04/2017  . Long term (current) use of  anticoagulants 03/04/2017  . Hemiparesis affecting left side as late effect of stroke ([place]) 03/04/2017  . Dyslipidemia (high LDL; low HDL) 03/04/2017  . VF (ventricular fibrillation) ([place]) 03/04/2017  . ICD (implantable cardioverter-defibrillator) in place 03/04/2017  . Heart disease     Past Surgical History:  Procedure Laterality Date  . CARDIAC CATHETERIZATION    . CARDIAC DEFIBRILLATOR PLACEMENT    . ICD IMPLANT Left 04/16/2016   [employer]       Family History  Problem Relation Age of Onset  . Heart attack Father   . Stroke Maternal Grandmother   . Stroke Maternal Grandfather   . Heart disease Paternal Grandmother   . Heart disease Paternal Grandfather     Social History   Tobacco Use  . Smoking status: Former Smoker    Types: Cigarettes    Start date: 03/03/2016  . Smokeless tobacco: Never Used  Vaping Use  . Vaping Use: Never used  Substance Use Topics  . Alcohol use: Yes    Comment: rarely  . Drug use: No    Home Medications Prior to Admission medications   Medication Sig Start Date End Date Taking? Authorizing Provider  oxyCODONE-acetaminophen (PERCOCET/ROXICET) 5-325 MG tablet Take 1 tablet by mouth every 8 (eight) hours as needed for severe pain. 05/13/20  Yes Sarahmarie Leavey, PA-C  PRADAXA 150 MG CAPS capsule TAKE 1 CAPSULE BY MOUTH TWICE A DAY 05/01/20   Croitoru, Mihai, MD  atorvastatin (LIPITOR) 80 MG tablet TAKE 1 TABLET (80 MG TOTAL) BY MOUTH DAILY AT 6 PM. 11/29/19   Croitoru, Mihai, MD  clopidogrel (PLAVIX) 75 MG tablet TAKE 1 TABLET BY MOUTH EVERY DAY 11/10/19   Croitoru, Mihai, MD  ENTRESTO 24-26 MG TAKE 1 TABLET BY MOUTH TWICE A DAY 11/29/19   Croitoru, Mihai, MD  ezetimibe (ZETIA) 10 MG tablet TAKE 1 TABLET BY MOUTH EVERY DAY 01/31/20   Croitoru, Mihai, MD  folic acid (FOLVITE) 1 MG tablet Take 1 tablet (1 mg total) by mouth daily. 08/05/17   Volanda Napoleon, MD  Levetiracetam 750 MG TB24 Take 1 tablet (750 mg total) by mouth 2 (two) times daily.  08/12/17   Croitoru, Mihai, MD  loperamide (IMODIUM) 2 MG capsule Take 1 capsule (2 mg total) by mouth 4 (four) times daily as needed for diarrhea or loose stools. 08/14/17   Khatri, Hina, PA-C  metoprolol succinate (TOPROL-XL) 50 MG 24 hr tablet TAKE 1 TABLET (50 MG TOTAL) BY MOUTH DAILY. TAKE WITH OR IMMEDIATELY FOLLOWING A MEAL. 07/12/19 11/15/19  Croitoru, Dani Gobble, MD  sildenafil (VIAGRA) 50 MG tablet Take 1 tablet (50 mg total) by mouth daily as needed for erectile dysfunction. 11/15/19   Croitoru, Mihai, MD  traZODone (DESYREL) 50 MG tablet Take 1 tablet (50 mg total) by mouth at bedtime. 08/05/17   Volanda Napoleon, MD    Allergies    Patient has no known allergies.  Review of Systems   Review of Systems  All other systems reviewed and are negative. Ten systems reviewed and are negative for acute change, except as noted in the HPI.   Physical Exam Updated Vital Signs BP 120/89   Pulse (!) 59   Temp 97.8 F (36.6 C) (Oral)   Resp 20   Ht 6' 2"  (1.88 m)   Wt 81.6 kg   SpO2 95%   BMI 23.11 kg/m   Physical Exam Vitals and nursing note reviewed.  Constitutional:      General: He is not in acute distress.    Appearance: Normal appearance. He is not ill-appearing, toxic-appearing or diaphoretic.  HENT:     Head: Normocephalic and atraumatic.     Right Ear: External ear normal.     Left Ear: External ear normal.     Nose: Nose normal.     Mouth/Throat:     Mouth: Mucous membranes are moist.     Pharynx: Oropharynx is clear. No oropharyngeal exudate or posterior oropharyngeal erythema.  Eyes:     Extraocular Movements: Extraocular movements intact.  Cardiovascular:     Rate and Rhythm: Normal rate and regular rhythm.     Pulses: Normal pulses.     Heart sounds: Normal heart sounds. No murmur heard. No friction rub. No gallop.   Pulmonary:     Effort: Pulmonary effort is normal. No respiratory distress.     Breath sounds: Normal breath sounds. No stridor. No wheezing, rhonchi or  rales.  Abdominal:     General: Abdomen is flat.     Palpations: Abdomen is soft.     Tenderness: There is no abdominal tenderness.     Comments: Abdomen is flat, soft, and nontender.  Musculoskeletal:        General: Tenderness present. Normal range of motion.     Cervical back: Normal range of motion and  neck supple. No tenderness.     Right lower leg: No edema.     Left lower leg: No edema.     Comments: Mild tenderness appreciated over the right lateral hip and posterior hip.  No palpable tenderness appreciated in the right leg.  Distal sensation intact.  2+ DP pulses.  Good cap refill.  Bilateral legs and feet are dry and warm to the touch.  No overlying skin changes.  No leg swelling.  Skin:    General: Skin is warm and dry.  Neurological:     General: No focal deficit present.     Mental Status: He is alert and oriented to person, place, and time.  Psychiatric:        Mood and Affect: Mood normal.        Behavior: Behavior normal.    ED Results / Procedures / Treatments   Labs (all labs ordered are listed, but only abnormal results are displayed) Labs Reviewed - No data to display  EKG None  Radiology US Venous Img Lower Unilateral Right  Result Date: 05/13/2020 CLINICAL DATA:  C/o Rt hip pain/burning sensation that radiates down lateral leg x 5-6 days, no injury. Hx Factor V Leiden, hx "heart clot" (was seen in [place].), on blood thinner EXAM: RIGHT LOWER EXTREMITY VENOUS DOPPLER ULTRASOUND TECHNIQUE: Gray-scale sonography with compression, as well as color and duplex ultrasound, were performed to evaluate the deep venous system(s) from the level of the common femoral vein through the popliteal and proximal calf veins. COMPARISON:  None. FINDINGS: VENOUS Normal compressibility of the common femoral, superficial femoral, and popliteal veins, as well as the visualized calf veins. Visualized portions of profunda femoral vein and great saphenous vein unremarkable. No filling defects  to suggest DVT on grayscale or color Doppler imaging. Doppler waveforms show normal direction of venous flow, normal respiratory plasticity and response to augmentation. Limited views of the contralateral common femoral vein are unremarkable. OTHER None. Limitations: none IMPRESSION: No evidence of a right lower extremity deep venous thrombosis. Electronically Signed   By: Lajean Manes M.D.   On: 05/13/2020 14:56   DG HIP UNILAT WITH PELVIS 2-3 VIEWS RIGHT  Result Date: 05/13/2020 CLINICAL DATA:  C/o Rt hip pain/burning sensation that radiates down lateral leg x 5-6 days, no injury. Hx Factor V Leiden, hx "heart clot" (was seen in [place].), on blood thinner EXAM: DG HIP (WITH OR WITHOUT PELVIS) 2-3V RIGHT COMPARISON:  None. FINDINGS: No fracture or bone lesion. Hip joints, SI joints and symphysis pubis are normally spaced and aligned. No arthropathic changes. Soft tissues are unremarkable. IMPRESSION: Negative. Electronically Signed   By: Lajean Manes M.D.   On: 05/13/2020 14:54   Procedures Procedures   Medications Ordered in ED Medications  oxyCODONE-acetaminophen (PERCOCET/ROXICET) 5-325 MG per tablet 1 tablet (1 tablet Oral Given 05/13/20 1358)   ED Course  I have reviewed the triage vital signs and the nursing notes.  Pertinent labs & imaging results that were available during my care of the patient were reviewed by me and considered in my medical decision making (see chart for details).    MDM Rules/Calculators/A&P                          2:00 PM patient is a 50 year old male with a complicated medical history including factor V Leiden anticoagulated on Pradaxa as well as Plavix.  Presents today due to atraumatic right hip and leg pain.  No swelling.  No chest pain or shortness of breath.  No abdominal pain.  Will obtain DVT ultrasound study of the right leg, x-rays of the right hip, as well as give a Percocet for pain.  3:22 PM DVT ultrasound is negative.  X-ray of the right hip is  negative.  He is neurovascularly intact in the right leg.  There is no swelling in the right leg.  He has been compliant with his anticoagulation.  No missed doses.  Unsure of the source of his symptoms.  He did have some nausea and vomiting 2 days ago but denies any current nausea or vomiting.  He is afebrile.  Doubt septic joint.  No systemic symptoms at this time.  He was given 1 Percocet upon arrival and notes moderate relief of his pain.  Will discharge on a short course of Percocet for breakthrough pain.  We will give him a referral to sports medicine if he finds that his symptoms persist.  Discussed return precautions at length.  Feel that he is stable for discharge and he is agreeable.  His questions were answered and he was amicable at the time of discharge.  Final Clinical Impression(s) / ED Diagnoses Final diagnoses:  Right leg pain   Rx / DC Orders ED Discharge Orders         Ordered    oxyCODONE-acetaminophen (PERCOCET/ROXICET) 5-325 MG tablet  Every 8 hours PRN        05/13/20 1518           Rayna Sexton, PA-C 05/13/20 1525    Dorie Rank, MD 05/14/20 [phone]

## 2020-05-13 NOTE — ED Triage Notes (Signed)
Right leg pain x 1 week

## 2020-05-18 ENCOUNTER — Ambulatory Visit (INDEPENDENT_AMBULATORY_CARE_PROVIDER_SITE_OTHER): Payer: BC Managed Care – PPO

## 2020-05-18 DIAGNOSIS — I4901 Ventricular fibrillation: Secondary | ICD-10-CM | POA: Diagnosis not present

## 2020-05-22 LAB — CUP PACEART REMOTE DEVICE CHECK
Battery Remaining Longevity: 86 mo
Battery Voltage: 2.94 V
Brady Statistic RV Percent Paced: 0.01 %
Date Time Interrogation Session: 20220514151814
HighPow Impedance: 79 Ohm
Implantable Pulse Generator Implant Date: 20180410
Lead Channel Impedance Value: 456 Ohm
Lead Channel Impedance Value: 532 Ohm
Lead Channel Pacing Threshold Amplitude: 0.5 V
Lead Channel Pacing Threshold Pulse Width: 0.4 ms
Lead Channel Sensing Intrinsic Amplitude: 18.5 mV
Lead Channel Sensing Intrinsic Amplitude: 18.5 mV
Lead Channel Setting Pacing Amplitude: 1.5 V
Lead Channel Setting Pacing Pulse Width: 0.4 ms
Lead Channel Setting Sensing Sensitivity: 0.3 mV

## 2020-06-09 NOTE — Progress Notes (Signed)
Remote ICD transmission.   

## 2020-07-28 ENCOUNTER — Other Ambulatory Visit: Payer: Self-pay | Admitting: Cardiovascular Disease

## 2020-08-17 ENCOUNTER — Ambulatory Visit (INDEPENDENT_AMBULATORY_CARE_PROVIDER_SITE_OTHER): Payer: BC Managed Care – PPO

## 2020-08-17 DIAGNOSIS — I4901 Ventricular fibrillation: Secondary | ICD-10-CM | POA: Diagnosis not present

## 2020-08-20 LAB — CUP PACEART REMOTE DEVICE CHECK
Battery Remaining Longevity: 79 mo
Battery Voltage: 2.96 V
Brady Statistic RV Percent Paced: 0.01 %
Date Time Interrogation Session: 20220811175945
HighPow Impedance: 73 Ohm
Implantable Pulse Generator Implant Date: 20180410
Lead Channel Impedance Value: 418 Ohm
Lead Channel Impedance Value: 513 Ohm
Lead Channel Pacing Threshold Amplitude: 0.5 V
Lead Channel Pacing Threshold Pulse Width: 0.4 ms
Lead Channel Sensing Intrinsic Amplitude: 20.5 mV
Lead Channel Sensing Intrinsic Amplitude: 20.5 mV
Lead Channel Setting Pacing Amplitude: 1.5 V
Lead Channel Setting Pacing Pulse Width: 0.4 ms
Lead Channel Setting Sensing Sensitivity: 0.3 mV

## 2020-09-07 NOTE — Progress Notes (Signed)
Remote ICD transmission.   

## 2020-09-20 ENCOUNTER — Telehealth: Payer: Self-pay

## 2020-09-20 ENCOUNTER — Inpatient Hospital Stay: Payer: BC Managed Care – PPO | Attending: Hematology & Oncology

## 2020-09-20 ENCOUNTER — Encounter: Payer: Self-pay | Admitting: Hematology & Oncology

## 2020-09-20 ENCOUNTER — Inpatient Hospital Stay: Payer: BC Managed Care – PPO | Admitting: Hematology & Oncology

## 2020-09-20 ENCOUNTER — Other Ambulatory Visit: Payer: Self-pay

## 2020-09-20 VITALS — BP 115/87 | HR 66 | Temp 98.0°F | Resp 16 | Wt 197.0 lb

## 2020-09-20 DIAGNOSIS — R7983 Abnormal findings of blood amino-acid level: Secondary | ICD-10-CM | POA: Insufficient documentation

## 2020-09-20 DIAGNOSIS — D6851 Activated protein C resistance: Secondary | ICD-10-CM | POA: Insufficient documentation

## 2020-09-20 DIAGNOSIS — I519 Heart disease, unspecified: Secondary | ICD-10-CM

## 2020-09-20 DIAGNOSIS — Z7901 Long term (current) use of anticoagulants: Secondary | ICD-10-CM | POA: Diagnosis not present

## 2020-09-20 LAB — CMP (CANCER CENTER ONLY)
ALT: 37 U/L (ref 0–44)
AST: 25 U/L (ref 15–41)
Albumin: 4.5 g/dL (ref 3.5–5.0)
Alkaline Phosphatase: 134 U/L — ABNORMAL HIGH (ref 38–126)
Anion gap: 7 (ref 5–15)
BUN: 8 mg/dL (ref 6–20)
CO2: 31 mmol/L (ref 22–32)
Calcium: 9.7 mg/dL (ref 8.9–10.3)
Chloride: 104 mmol/L (ref 98–111)
Creatinine: 1.12 mg/dL (ref 0.61–1.24)
GFR, Estimated: >60 mL/min (ref >60)
Glucose, Bld: 110 mg/dL — ABNORMAL HIGH (ref 70–99)
Potassium: 3.8 mmol/L (ref 3.5–5.1)
Sodium: 142 mmol/L (ref 135–145)
Total Bilirubin: 1 mg/dL (ref 0.3–1.2)
Total Protein: 7 g/dL (ref 6.5–8.1)

## 2020-09-20 LAB — CBC WITH DIFFERENTIAL (CANCER CENTER ONLY)
Abs Immature Granulocytes: 0.02 10*3/uL (ref 0.00–0.07)
Basophils Absolute: 0.1 10*3/uL (ref 0.0–0.1)
Basophils Relative: 1 %
Eosinophils Absolute: 0.7 10*3/uL — ABNORMAL HIGH (ref 0.0–0.5)
Eosinophils Relative: 8 %
HCT: 49.4 % (ref 39.0–52.0)
Hemoglobin: 16.9 g/dL (ref 13.0–17.0)
Immature Granulocytes: 0 %
Lymphocytes Relative: 25 %
Lymphs Abs: 2.4 10*3/uL (ref 0.7–4.0)
MCH: 30.7 pg (ref 26.0–34.0)
MCHC: 34.2 g/dL (ref 30.0–36.0)
MCV: 89.8 fL (ref 80.0–100.0)
Monocytes Absolute: 0.7 10*3/uL (ref 0.1–1.0)
Monocytes Relative: 8 %
Neutro Abs: 5.6 10*3/uL (ref 1.7–7.7)
Neutrophils Relative %: 58 %
Platelet Count: 294 10*3/uL (ref 150–400)
RBC: 5.5 MIL/uL (ref 4.22–5.81)
RDW: 12.4 % (ref 11.5–15.5)
WBC Count: 9.5 10*3/uL (ref 4.0–10.5)
nRBC: 0 % (ref 0.0–0.2)

## 2020-09-20 NOTE — Telephone Encounter (Signed)
Appts made per 09/20/20 los, pt to view through [facility] and a calendar has been mailed   Roy Lozano

## 2020-09-20 NOTE — Progress Notes (Signed)
Hematology and Oncology Follow Up Visit  Roy Lozano [id number] Aug 20, 1970 50 y.o. 09/20/2020   Principle Diagnosis:  Factor V Leiden mutation -heterozygous Cerebrovascular disease-CVA Hyper homocystinemia  Current Therapy:   Plavix 75 mg p.o. daily Pradaxa 150 mg p.o. twice daily Folic acid 1 mg p.o. daily   Interim History:  [name] is here today for follow-up.  So far, everything is going well.  We last saw him 6 months ago.  He has had no new problems.  He is on Pradaxa.  He is on Plavix.  He has had no issues with bleeding.  He has not had any seizures from the epilepsy.  He does have some neurological issues that are chronic.  He does have some weakness over in his left hand.  There is been no palpitations.  He does have the implantable defibrillator.  There is been no change in bowel or bladder habits.  He has had no rashes.  There has been no issues with COVID.  Currently, I would say his performance status is ECOG 2.    Medications:  Allergies as of 09/20/2020   No Known Allergies      Medication List        Accurate as of September 20, 2020  8:21 AM. If you have any questions, ask your nurse or doctor.          STOP taking these medications    sildenafil 50 MG tablet Commonly known as: Viagra Stopped by: Volanda Napoleon, MD       TAKE these medications    atorvastatin 80 MG tablet Commonly known as: LIPITOR TAKE 1 TABLET (80 MG TOTAL) BY MOUTH DAILY AT 6 PM.   clopidogrel 75 MG tablet Commonly known as: PLAVIX TAKE 1 TABLET BY MOUTH EVERY DAY   Entresto 24-26 MG Generic drug: sacubitril-valsartan TAKE 1 TABLET BY MOUTH TWICE A DAY   ezetimibe 10 MG tablet Commonly known as: ZETIA TAKE 1 TABLET BY MOUTH EVERY DAY   folic acid 1 MG tablet Commonly known as: FOLVITE Take 1 tablet (1 mg total) by mouth daily.   Levetiracetam 750 MG Tb24 Take 1 tablet (750 mg total) by mouth 2 (two) times daily.   Levetiracetam 750 MG  Tb24 Take by mouth.   loperamide 2 MG capsule Commonly known as: IMODIUM Take 1 capsule (2 mg total) by mouth 4 (four) times daily as needed for diarrhea or loose stools.   metoprolol succinate 50 MG 24 hr tablet Commonly known as: TOPROL-XL TAKE 1 TABLET (50 MG TOTAL) BY MOUTH DAILY. TAKE WITH OR IMMEDIATELY FOLLOWING A MEAL.   oxyCODONE-acetaminophen 5-325 MG tablet Commonly known as: PERCOCET/ROXICET Take 1 tablet by mouth every 8 (eight) hours as needed for severe pain.   Pradaxa 150 MG Caps capsule Generic drug: dabigatran TAKE 1 CAPSULE BY MOUTH TWICE A DAY   traZODone 50 MG tablet Commonly known as: DESYREL Take 1 tablet (50 mg total) by mouth at bedtime.        Allergies: No Known Allergies  Past Medical History, Surgical history, Social history, and Family History were reviewed and updated.  Review of Systems: Review of Systems  Constitutional: Negative.   HENT: Negative.    Eyes: Negative.   Respiratory: Negative.    Cardiovascular: Negative.   Gastrointestinal: Negative.   Genitourinary: Negative.   Musculoskeletal: Negative.   Skin: Negative.   Neurological: Negative.   Endo/Heme/Allergies: Negative.   Psychiatric/Behavioral: Negative.       Physical Exam:  weight is 197 lb (89.4 kg). His oral temperature is 98 F (36.7 C). His blood pressure is 115/87 and his pulse is 66. His respiration is 16 and oxygen saturation is 98%.   Wt Readings from Last 3 Encounters:  09/20/20 197 lb (89.4 kg)  05/13/20 180 lb (81.6 kg)  04/10/20 189 lb (85.7 kg)    Physical Exam Vitals reviewed.  HENT:     Head: Normocephalic and atraumatic.  Eyes:     Pupils: Pupils are equal, round, and reactive to light.  Cardiovascular:     Rate and Rhythm: Normal rate and regular rhythm.     Heart sounds: Normal heart sounds.  Pulmonary:     Effort: Pulmonary effort is normal.     Breath sounds: Normal breath sounds.  Abdominal:     General: Bowel sounds are normal.      Palpations: Abdomen is soft.  Musculoskeletal:        General: No tenderness or deformity. Normal range of motion.     Cervical back: Normal range of motion.  Lymphadenopathy:     Cervical: No cervical adenopathy.  Skin:    General: Skin is warm and dry.     Findings: No erythema or rash.  Neurological:     Mental Status: He is alert and oriented to person, place, and time.  Psychiatric:        Behavior: Behavior normal.        Thought Content: Thought content normal.        Judgment: Judgment normal.     Lab Results  Component Value Date   WBC 9.5 09/20/2020   HGB 16.9 09/20/2020   HCT 49.4 09/20/2020   MCV 89.8 09/20/2020   PLT 294 09/20/2020   No results found for: FERRITIN, IRON, TIBC, UIBC, IRONPCTSAT Lab Results  Component Value Date   RBC 5.50 09/20/2020   No results found for: KPAFRELGTCHN, LAMBDASER, KAPLAMBRATIO No results found for: IGGSERUM, IGA, IGMSERUM No results found for: Odetta Pink, SPEI   Chemistry      Component Value Date/Time   NA 142 04/10/2020 1341   NA 145 (H) 11/15/2019 1015   K 3.9 04/10/2020 1341   CL 105 04/10/2020 1341   CO2 30 04/10/2020 1341   BUN 11 04/10/2020 1341   BUN 7 11/15/2019 1015   CREATININE 1.05 04/10/2020 1341      Component Value Date/Time   CALCIUM 9.9 04/10/2020 1341   ALKPHOS 125 04/10/2020 1341   AST 27 04/10/2020 1341   ALT 39 04/10/2020 1341   BILITOT 0.8 04/10/2020 1341       Impression and Plan: Ms. Roy Lozano is a pleasant 50 yo caucasian gentleman with Factor V Leiden mutation, heterozygous, history of cerebrovascular CVA and hyper homocystinemia.   He continues to do well on daily Pradaxa, Plavix and folic acid.   I do not see any problems with respect to hypercoagulability right now.  Hopefully, he will continue to do well with the combined Pradaxa and Plavix.  He is also on folic acid because of elevated homocysteine level.  We will plan to  get him back next year now.  I think we can probably go 1 year for follow-up.     Volanda Napoleon, MD 9/14/20228:21 AM

## 2020-09-30 ENCOUNTER — Encounter: Payer: Self-pay | Admitting: *Deleted

## 2020-10-01 ENCOUNTER — Telehealth: Payer: Self-pay | Admitting: *Deleted

## 2020-10-01 NOTE — Telephone Encounter (Signed)
Called and left a message times three plus sent a MyChart message that the patient's appointment with Dr. Sallyanne Kuster for 9/26 would need to be rescheduled due to an emergency.

## 2020-10-02 ENCOUNTER — Encounter: Payer: BC Managed Care – PPO | Admitting: Cardiovascular Disease

## 2020-10-02 ENCOUNTER — Other Ambulatory Visit: Payer: Self-pay

## 2020-10-19 DIAGNOSIS — G40209 Localization-related (focal) (partial) symptomatic epilepsy and epileptic syndromes with complex partial seizures, not intractable, without status epilepticus: Secondary | ICD-10-CM | POA: Diagnosis not present

## 2020-10-19 DIAGNOSIS — G4700 Insomnia, unspecified: Secondary | ICD-10-CM | POA: Diagnosis not present

## 2020-10-20 ENCOUNTER — Other Ambulatory Visit: Payer: Self-pay | Admitting: Cardiovascular Disease

## 2020-11-16 ENCOUNTER — Ambulatory Visit (INDEPENDENT_AMBULATORY_CARE_PROVIDER_SITE_OTHER): Payer: BC Managed Care – PPO

## 2020-11-16 DIAGNOSIS — I4901 Ventricular fibrillation: Secondary | ICD-10-CM | POA: Diagnosis not present

## 2020-11-16 LAB — CUP PACEART REMOTE DEVICE CHECK
Battery Remaining Longevity: 76 mo
Battery Voltage: 2.95 V
Brady Statistic RV Percent Paced: 0.01 %
Date Time Interrogation Session: 20221110132507
HighPow Impedance: 68 Ohm
Implantable Pulse Generator Implant Date: 20180410
Lead Channel Impedance Value: 399 Ohm
Lead Channel Impedance Value: 475 Ohm
Lead Channel Pacing Threshold Amplitude: 0.5 V
Lead Channel Pacing Threshold Pulse Width: 0.4 ms
Lead Channel Sensing Intrinsic Amplitude: 19.125 mV
Lead Channel Sensing Intrinsic Amplitude: 19.125 mV
Lead Channel Setting Pacing Amplitude: 1.5 V
Lead Channel Setting Pacing Pulse Width: 0.4 ms
Lead Channel Setting Sensing Sensitivity: 0.3 mV

## 2020-11-20 ENCOUNTER — Ambulatory Visit (INDEPENDENT_AMBULATORY_CARE_PROVIDER_SITE_OTHER): Payer: BC Managed Care – PPO | Admitting: Cardiovascular Disease

## 2020-11-20 ENCOUNTER — Other Ambulatory Visit: Payer: Self-pay

## 2020-11-20 ENCOUNTER — Encounter: Payer: Self-pay | Admitting: Cardiovascular Disease

## 2020-11-20 VITALS — BP 120/90 | HR 55 | Ht 74.0 in | Wt 197.8 lb

## 2020-11-20 DIAGNOSIS — Z95810 Presence of automatic (implantable) cardiac defibrillator: Secondary | ICD-10-CM

## 2020-11-20 DIAGNOSIS — D6851 Activated protein C resistance: Secondary | ICD-10-CM | POA: Diagnosis not present

## 2020-11-20 DIAGNOSIS — E785 Hyperlipidemia, unspecified: Secondary | ICD-10-CM | POA: Diagnosis not present

## 2020-11-20 DIAGNOSIS — Z7901 Long term (current) use of anticoagulants: Secondary | ICD-10-CM

## 2020-11-20 DIAGNOSIS — I5042 Chronic combined systolic (congestive) and diastolic (congestive) heart failure: Secondary | ICD-10-CM

## 2020-11-20 DIAGNOSIS — I4901 Ventricular fibrillation: Secondary | ICD-10-CM | POA: Diagnosis not present

## 2020-11-20 DIAGNOSIS — N522 Drug-induced erectile dysfunction: Secondary | ICD-10-CM

## 2020-11-20 DIAGNOSIS — E7211 Homocystinuria: Secondary | ICD-10-CM

## 2020-11-20 DIAGNOSIS — I2510 Atherosclerotic heart disease of native coronary artery without angina pectoris: Secondary | ICD-10-CM | POA: Diagnosis not present

## 2020-11-20 DIAGNOSIS — I69354 Hemiplegia and hemiparesis following cerebral infarction affecting left non-dominant side: Secondary | ICD-10-CM

## 2020-11-20 LAB — LIPID PANEL
Chol/HDL Ratio: 3.6 ratio (ref 0.0–5.0)
Cholesterol, Total: 104 mg/dL (ref 100–199)
HDL: 29 mg/dL — ABNORMAL LOW (ref >39)
LDL Chol Calc (NIH): 51 mg/dL (ref 0–99)
Triglycerides: 134 mg/dL (ref 0–149)
VLDL Cholesterol Cal: 24 mg/dL (ref 5–40)

## 2020-11-20 NOTE — Patient Instructions (Signed)
Medication Instructions:  No changes *If you need a refill on your cardiac medications before your next appointment, please call your pharmacy*   Lab Work: Your provider would like for you to have the following labs today: lipid  If you have labs (blood work) drawn today and your tests are completely normal, you will receive your results only by: [place] (if you have MyChart) OR A paper copy in the mail If you have any lab test that is abnormal or we need to change your treatment, we will call you to review the results.   Testing/Procedures: None ordered   Follow-Up: At [facility], you and your health needs are our priority.  As part of our continuing mission to provide you with exceptional heart care, we have created designated Provider Care Teams.  These Care Teams include your primary Cardiologist (physician) and Advanced Practice Providers (APPs -  Physician Assistants and Nurse Practitioners) who all work together to provide you with the care you need, when you need it.  We recommend signing up for the patient portal called "MyChart".  Sign up information is provided on this After Visit Summary.  MyChart is used to connect with patients for Virtual Visits (Telemedicine).  Patients are able to view lab/test results, encounter notes, upcoming appointments, etc.  Non-urgent messages can be sent to your provider as well.   To learn more about what you can do with MyChart, go to [email or web address].    Your next appointment:   12 month(s)  The format for your next appointment:   In Person  Provider:   Sanda Klein, MD

## 2020-11-20 NOTE — Progress Notes (Signed)
Cardiology Office Note:    Date:  11/22/2020   ID:  Roy Lozano, DOB May 08, 1970, MRN [id number]  PCP:  Patient, No Pcp Per (Inactive)  Cardiologist:  Roy Lozano  Referring MD: No ref. provider found   No chief complaint on file.   History of Present Illness:    Roy Lozano is a 50 y.o. male with a hx of previous myocardial infarction (suspect anterior STEMI), history of cardiac arrest in April 2018, followed by implantation of a single-chamber defibrillator (Medtronic Visia AF) with 2 subsequent appropriate defibrillator discharges for ventricular fibrillation, most recently in January 2019.  (while living in [place]).  He has a history of factor V Leiden and hyper homocystinemia and is on Pradaxa for previous stroke.  He has done well since moving to [facility] and has not had any new cardiac events.  The patient specifically denies any chest pain at rest exertion, dyspnea at rest or with exertion, orthopnea, paroxysmal nocturnal dyspnea, syncope, palpitations, focal neurological deficits, intermittent claudication, lower extremity edema, unexplained weight gain, cough, hemoptysis or wheezing.  He reports that taking ezetimibe sometimes makes his bowels very loose so he avoids taking this on days when he leaves the house.  He estimates that he takes at 90% of the time.  Interrogation of his defibrillator shows normal device function.  He has had 1 episode of nonsustained VT since April (6 beats at 2 and 7 bpm in August).  This was asymptomatic.  His device longevity is estimated at 6.3 years.  All lead parameters are excellent.  There have been no episodes of atrial fibrillation and he does not require pacing.  He is a little less sedentary than the past.  His OptiVol has been mostly in normal range with exception of very brief period of deviation several months ago that resolved spontaneously.  His insurance company inquired why he needs to specifically receive Pradaxa. ICD  check in the office today. Interrogation of the device shows that he has delivered 2 shocks.  1 of them was in November 2018 and the details have been deleted from the device memory.  The other one occurred on February 04 2017 at 0351 hrs.  He had abrupt onset of what appears to be polymorphic VT/VF with a cycle length varying between 130 and 190 ms and appropriate single defibrillator discharge at 36 J to convert the rhythm to ventricular pacing.  He was unaware of the discharge which occurred in the middle of the night.  He was warned of its occurrence due to the device alarms.  Unfortunately, Roy Lozano also has some mild-moderate memory issues since his cardiac arrest and has not always available to provide details about his medical problems.  He is clearly a very intelligent and educated gentleman, he is here alone today.  He reports that his cardiac arrest occurred during hospitalization for pneumonia in April 2018.   It is not clear about the sequence of events but he had a large heart attack of the front wall of his heart.  He received stents.  He has also had a stroke which has left him with mild left-sided hemiparesis.  He states that his left hand is always cold.  He has limited dexterity and ability to grasp without limb and is applying for disability based on this.  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  He is now living with his 8 year old mother.  In addition to his cardiac problem has a history of epilepsy and factor V Leiden deficiency and hyper  homocystinemia.  He reports having had a previous stroke at age 59.  His seizure disorder took a long time to diagnose accurately and treat, but is currently well controlled with antiepileptic medications.  He has a history of hypertension and hyperlipidemia.  He smoked very briefly and in very small amounts: roughly a pack every 2 weeks for 1-1/2 years, quit 2018.  His list of medications includes clopidogrel and Pradaxa as well as a beta-blocker and low  dose angiotensin receptor blocker.  He has been prescribed high-dose atorvastatin.  Past Medical History:  Diagnosis Date   AMI (acute myocardial infarction) ([place])    Arrhythmia    Cardiac arrest ([place])    Clotting disorder ([place])    factor 5 Leiden    Coronary artery disease    CVA (cerebral vascular accident) ([place])    Epilepsy ([place])    Heart disease    Hyperlipidemia    Hypertension    IBS (irritable bowel syndrome)    Seizures ([place])    Stroke ([place])    2018   Ulcerative colitis ([place])     Past Surgical History:  Procedure Laterality Date   CARDIAC CATHETERIZATION     CARDIAC DEFIBRILLATOR PLACEMENT     ICD IMPLANT Left 04/16/2016   Medtronic    Current Medications:  Current Outpatient Medications on File Prior to Visit  Medication Sig Dispense Refill   atorvastatin (LIPITOR) 80 MG tablet TAKE 1 TABLET (80 MG TOTAL) BY MOUTH DAILY AT 6 PM. 90 tablet 3   clopidogrel (PLAVIX) 75 MG tablet TAKE 1 TABLET BY MOUTH EVERY DAY 90 tablet 2   ENTRESTO 24-26 MG TAKE 1 TABLET BY MOUTH TWICE A DAY 180 tablet 2   ezetimibe (ZETIA) 10 MG tablet TAKE 1 TABLET BY MOUTH EVERY DAY 90 tablet 2   folic acid (FOLVITE) 1 MG tablet Take 1 tablet (1 mg total) by mouth daily. 90 tablet 6   Levetiracetam 750 MG TB24 Take 1 tablet (750 mg total) by mouth 2 (two) times daily. 60 tablet 0   metoprolol succinate (TOPROL-XL) 50 MG 24 hr tablet TAKE 1 TABLET (50 MG TOTAL) BY MOUTH DAILY. TAKE WITH OR IMMEDIATELY FOLLOWING A MEAL. 90 tablet 3   PRADAXA 150 MG CAPS capsule TAKE 1 CAPSULE BY MOUTH TWICE A DAY 180 capsule 1   traZODone (DESYREL) 50 MG tablet Take 50 mg by mouth as needed.     Levetiracetam 750 MG TB24 Take by mouth.     loperamide (IMODIUM) 2 MG capsule Take 1 capsule (2 mg total) by mouth 4 (four) times daily as needed for diarrhea or loose stools. (Patient not taking: Reported on 11/20/2020) 6 capsule 0   oxyCODONE-acetaminophen (PERCOCET/ROXICET) 5-325 MG tablet Take 1 tablet by mouth every  8 (eight) hours as needed for severe pain. (Patient not taking: Reported on 11/20/2020) 10 tablet 0   traZODone (DESYREL) 50 MG tablet Take 1 tablet (50 mg total) by mouth at bedtime. 30 tablet 4   No current facility-administered medications on file prior to visit.      Allergies:   Patient has no known allergies.   Social History   Socioeconomic History   Marital status: Single    Spouse name: Not on file   Number of children: Not on file   Years of education: Not on file   Highest education level: Not on file  Occupational History   Not on file  Tobacco Use   Smoking status: Former    Types: Cigarettes  Start date: 03/03/2016   Smokeless tobacco: Never  Vaping Use   Vaping Use: Never used  Substance and Sexual Activity   Alcohol use: Yes    Comment: rarely   Drug use: No   Sexual activity: Yes  Other Topics Concern   Not on file  Social History Narrative   ** Merged History Encounter **       Social Determinants of Health   Financial Resource Strain: Not on file  Food Insecurity: Not on file  Transportation Needs: Not on file  Physical Activity: Not on file  Stress: Not on file  Social Connections: Not on file     Family History: The patient's family history includes Heart attack in his father; Heart disease in his paternal grandfather and paternal grandmother; Stroke in his maternal grandfather and maternal grandmother.  ROS:   Please see the history of present illness.  All other systems are reviewed and are negative.  EKGs/Labs/Other Studies Reviewed:    The following studies were reviewed today: ICD check  EKG:  EKG isordered today.  It is very similar to his previous tracing, shows sinus bradycardia 55 bpm with a QS pattern in leads V1-V2 and T wave inversion V4-V6 without any new repolarization abnormalities.  QTc 367 ms.  Recent Labs: 09/20/2020: ALT 37; BUN 8; Creatinine 1.12; Hemoglobin 16.9; Platelet Count 294; Potassium 3.8; Sodium 142   Recent Lipid Panel    Component Value Date/Time   CHOL 104 11/20/2020 0951   TRIG 134 11/20/2020 0951   HDL 29 (L) 11/20/2020 0951   CHOLHDL 3.6 11/20/2020 0951   LDLCALC 51 11/20/2020 0951    Physical Exam:    VS:  BP 120/90 (BP Location: Left Arm, Patient Position: Sitting, Cuff Size: Normal)   Pulse (!) 55   Ht 6' 2"  (1.88 m)   Wt 197 lb 12.8 oz (89.7 kg)   SpO2 97%   BMI 25.40 kg/m     Wt Readings from Last 3 Encounters:  11/20/20 197 lb 12.8 oz (89.7 kg)  09/20/20 197 lb (89.4 kg)  05/13/20 180 lb (81.6 kg)      General: Alert, oriented x3, no distress, healthy left subclavian defibrillator site. Head: no evidence of trauma, PERRL, EOMI, no exophtalmos or lid lag, no myxedema, no xanthelasma; normal ears, nose and oropharynx Neck: normal jugular venous pulsations and no hepatojugular reflux; brisk carotid pulses without delay and no carotid bruits Chest: clear to auscultation, no signs of consolidation by percussion or palpation, normal fremitus, symmetrical and full respiratory excursions Cardiovascular: normal position and quality of the apical impulse, regular rhythm, normal first and second heart sounds, no murmurs, rubs or gallops Abdomen: no tenderness or distention, no masses by palpation, no abnormal pulsatility or arterial bruits, normal bowel sounds, no hepatosplenomegaly Extremities: no clubbing, cyanosis or edema; 2+ radial, ulnar and brachial pulses bilaterally; 2+ right femoral, posterior tibial and dorsalis pedis pulses; 2+ left femoral, posterior tibial and dorsalis pedis pulses; no subclavian or femoral bruits Neurological: grossly nonfocal.  Barely noticeable weakness in the left upper extremity and some clumsiness in his left hand. Psych: Normal mood and affect   ASSESSMENT:    1. VF (ventricular fibrillation) ([place])   2. Coronary artery disease involving native coronary artery of native heart without angina pectoris   3. Chronic combined systolic  and diastolic heart failure ([place])   4. Factor V Leiden ([place])   5. Hyperhomocysteinemia ([place])   6. Long term (current) use of anticoagulants   7.  Hemiparesis affecting left side as late effect of stroke ([place])   8. Dyslipidemia (high LDL; low HDL)   9. ICD (implantable cardioverter-defibrillator) in place   10. Drug-induced erectile dysfunction     PLAN:    In order of problems listed above:  VF: 2 very brief episodes of nonsustained VT in the last 8 months.  No therapy delivered since 2019. CAD: Denies angina pectoris.  Chronic therapy with clopidogrel, high-dose atorvastatin and taking ezetimibe most of the time.  Also on beta-blockers.   We do not have the details of his coronary anatomy at his cardiac catheterization in [place].  No bleeding complications on combination antiplatelet and anticoagulant therapy. The wall motion abnormalities on echo were consistent with infarction in the territory of a large "wraparound" LAD artery (but could also be consistent with Takotsubo syndrome, although this should have been reversible). CHF: Clinically euvolemic without the use of loop diuretics, NYHA functional class I.  OptiVol in normal range.  Most recent LVEF was estimated 35-40%.  Would not tolerate higher doses of metoprolol due to bradycardia or Entresto due to symptomatic hypotension. Factor V Leiden and hyper homocystinemia: Followed by Dr. Marin Olp.  He is in agreement with the choice of oral anticoagulant and feels that his procoagulant condition has been important contributor to his stroke at age 23.  I believe that Pradaxa is a better choice for anticoagulation since it is an Antithrombin, rather than switching him to an antit-Xa agent. Anticoagulation: No bleeding complications even though he is also taking clopidogrel. L hemiparesis: Loss of dexterity impairs his ability to use devices such as keyboard and computer mouse. Together with his memory impairment, this will make it very  challenging to have a job. HLP: Recheck lipid profile today.  LDL has been very well controlled, low HDL is likely an inherited condition and will improve substantially with physical activity.  He is already relatively lean.   ICD: Normal device function.  Continue remote downloads every 3 months.  Has had 2 appropriate shocks for ventricular fibrillation more than 3 years  ago.  On beta-blockers.   ED: Occasionally taking sildenafil.  Reminded him never to combine this with nitrates within 24 hours of each other.  Medication Adjustments/Labs and Tests Ordered: Current medicines are reviewed at length with the patient today.  Concerns regarding medicines are outlined above.  Orders Placed This Encounter  Procedures   Lipid panel   EKG 12-Lead    No orders of the defined types were placed in this encounter.   Signed, Sanda Klein, MD  11/22/2020 9:09 AM    [facility]

## 2020-11-22 ENCOUNTER — Encounter: Payer: Self-pay | Admitting: Cardiovascular Disease

## 2020-11-22 ENCOUNTER — Telehealth: Payer: Self-pay | Admitting: *Deleted

## 2020-11-22 NOTE — Telephone Encounter (Signed)
Left a message for the patient to call back concerning his Pradaxa.

## 2020-11-23 ENCOUNTER — Encounter: Payer: Self-pay | Admitting: *Deleted

## 2020-11-24 NOTE — Progress Notes (Signed)
Remote ICD transmission.   

## 2020-11-29 MED ORDER — DABIGATRAN ETEXILATE MESYLATE 150 MG PO CAPS: 150.0000 mg | ORAL_CAPSULE | Freq: Two times a day (BID) | ORAL | 11 refills | Status: AC

## 2020-11-29 NOTE — Telephone Encounter (Signed)
Left a message for the patient to call back.  The patient's insurance will not accept the Brand name Pradaxa but will take the generic form-dabigatran. This has been sent in for him.

## 2020-12-18 ENCOUNTER — Encounter: Payer: Self-pay | Admitting: Cardiovascular Disease

## 2021-02-15 ENCOUNTER — Ambulatory Visit (INDEPENDENT_AMBULATORY_CARE_PROVIDER_SITE_OTHER): Payer: BC Managed Care – PPO

## 2021-02-15 DIAGNOSIS — I4901 Ventricular fibrillation: Secondary | ICD-10-CM

## 2021-02-15 LAB — CUP PACEART REMOTE DEVICE CHECK
Battery Remaining Longevity: 66 mo
Battery Voltage: 2.94 V
Brady Statistic RV Percent Paced: 0.02 %
Date Time Interrogation Session: 20230209061606
HighPow Impedance: 73 Ohm
Implantable Pulse Generator Implant Date: 20180410
Lead Channel Impedance Value: 418 Ohm
Lead Channel Impedance Value: 513 Ohm
Lead Channel Pacing Threshold Amplitude: 0.5 V
Lead Channel Pacing Threshold Pulse Width: 0.4 ms
Lead Channel Sensing Intrinsic Amplitude: 22 mV
Lead Channel Sensing Intrinsic Amplitude: 22 mV
Lead Channel Setting Pacing Amplitude: 1.5 V
Lead Channel Setting Pacing Pulse Width: 0.4 ms
Lead Channel Setting Sensing Sensitivity: 0.3 mV

## 2021-02-20 NOTE — Progress Notes (Signed)
Remote ICD transmission.   

## 2021-04-10 ENCOUNTER — Other Ambulatory Visit: Payer: BC Managed Care – PPO

## 2021-04-10 ENCOUNTER — Ambulatory Visit: Payer: BC Managed Care – PPO | Admitting: Hematology & Oncology

## 2021-05-04 ENCOUNTER — Other Ambulatory Visit: Payer: Self-pay | Admitting: Cardiovascular Disease

## 2021-05-17 ENCOUNTER — Ambulatory Visit (INDEPENDENT_AMBULATORY_CARE_PROVIDER_SITE_OTHER): Payer: BC Managed Care – PPO

## 2021-05-17 DIAGNOSIS — I4901 Ventricular fibrillation: Secondary | ICD-10-CM

## 2021-05-18 LAB — CUP PACEART REMOTE DEVICE CHECK
Battery Remaining Longevity: 66 mo
Battery Voltage: 2.94 V
Brady Statistic RV Percent Paced: 0.02 %
Date Time Interrogation Session: 20230512101554
HighPow Impedance: 72 Ohm
Implantable Pulse Generator Implant Date: 20180410
Lead Channel Impedance Value: 456 Ohm
Lead Channel Impedance Value: 551 Ohm
Lead Channel Pacing Threshold Amplitude: 0.5 V
Lead Channel Pacing Threshold Pulse Width: 0.4 ms
Lead Channel Sensing Intrinsic Amplitude: 24.875 mV
Lead Channel Sensing Intrinsic Amplitude: 24.875 mV
Lead Channel Setting Pacing Amplitude: 1.5 V
Lead Channel Setting Pacing Pulse Width: 0.4 ms
Lead Channel Setting Sensing Sensitivity: 0.3 mV

## 2021-05-24 NOTE — Progress Notes (Signed)
Remote ICD transmission.   

## 2021-06-04 ENCOUNTER — Other Ambulatory Visit: Payer: Self-pay | Admitting: Cardiovascular Disease

## 2021-06-22 ENCOUNTER — Other Ambulatory Visit: Payer: Self-pay | Admitting: Cardiovascular Disease

## 2021-07-07 DEATH — deceased

## 2021-07-09 ENCOUNTER — Telehealth: Payer: Self-pay | Admitting: Cardiovascular Disease

## 2021-07-09 ENCOUNTER — Telehealth: Payer: Self-pay | Admitting: Hematology & Oncology

## 2021-07-09 NOTE — Telephone Encounter (Signed)
The number listed for mother Shahab Polhamus is not in service, His mobile phone goes to voicemail.

## 2021-07-09 NOTE — Telephone Encounter (Signed)
New Message:     Patient's mother called. She wanted Dr C and [facility] to know that the patient  passsed away on last Thursday(07-05-21).

## 2021-07-09 NOTE — Telephone Encounter (Signed)
Pt's mother called in to inform us that pt was now deceased. Confirmed with obituary from [facility] of patient's passing on Jul 31, 2021.

## 2021-09-20 ENCOUNTER — Ambulatory Visit: Payer: BC Managed Care – PPO | Admitting: Hematology & Oncology

## 2021-09-20 ENCOUNTER — Other Ambulatory Visit: Payer: BC Managed Care – PPO

## 2023-02-28 IMAGING — US US EXTREM LOW VENOUS*R*
1 series · 14 of 24 positions shown · non-contrast
Comparison: None.

CLINICAL DATA: C/o Rt hip pain/burning sensation that radiates down
lateral leg x 5-6 days, no injury. Hx Factor V Leiden, hx "heart
clot" (was seen [place] on blood thinner

EXAM:
RIGHT LOWER EXTREMITY VENOUS DOPPLER ULTRASOUND
TECHNIQUE: Gray-scale sonography with compression, as well as color and duplex
ultrasound, were performed to evaluate the deep venous system(s)
from the level of the common femoral vein through the popliteal and
proximal calf veins.

[Series 1: us extrem low venous*right* · 14 of 41 slices shown]
[im 1/41]
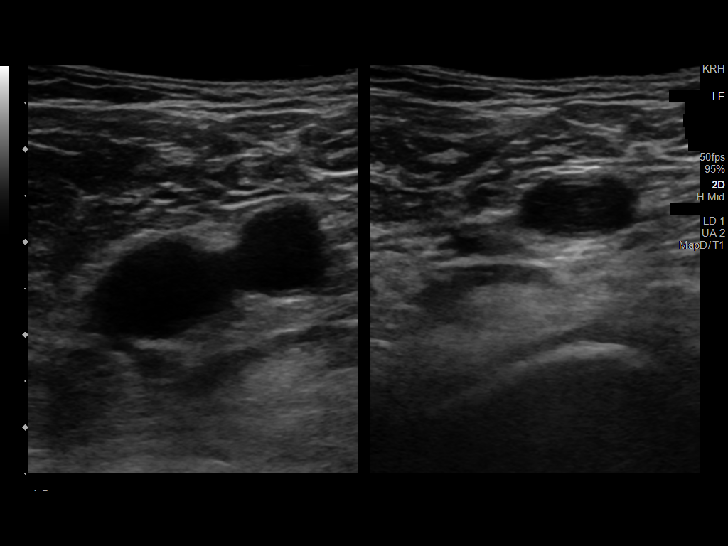
[im 4/41]
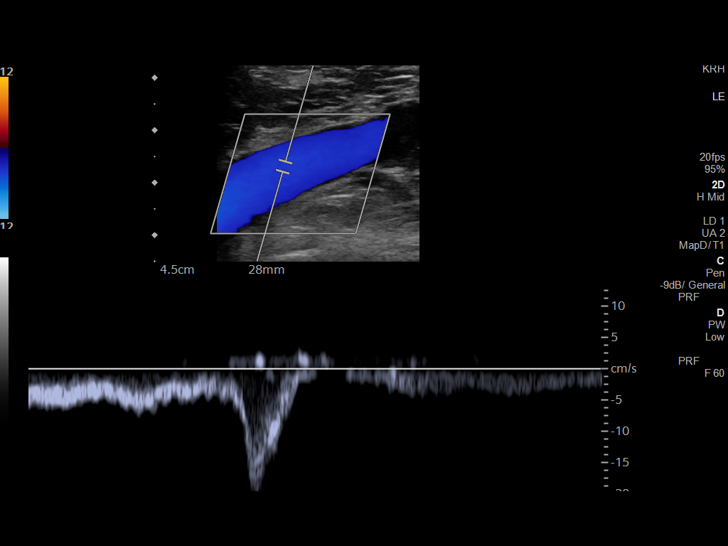
[im 7/41]
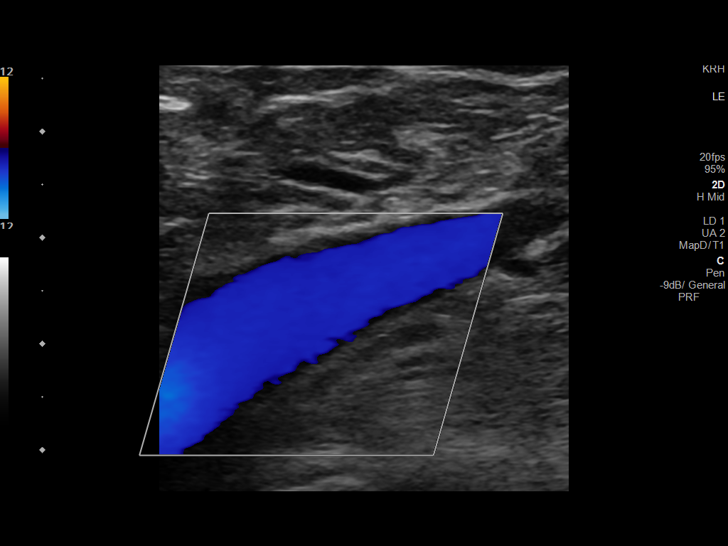
[im 11/41]
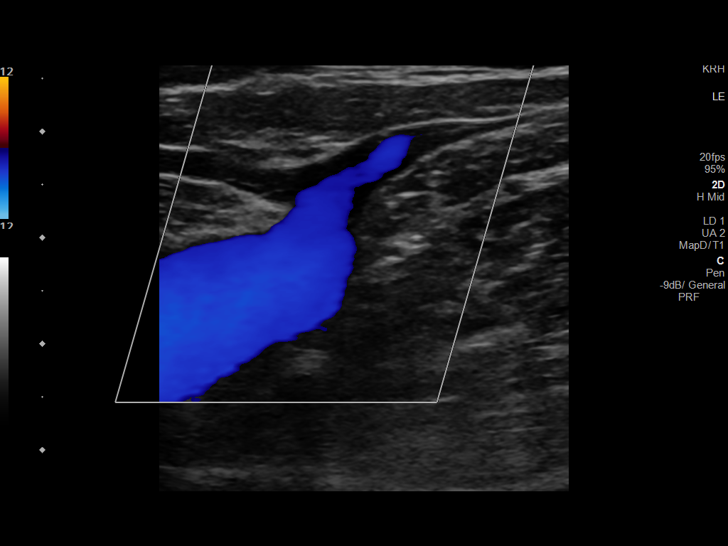
[im 13/41]
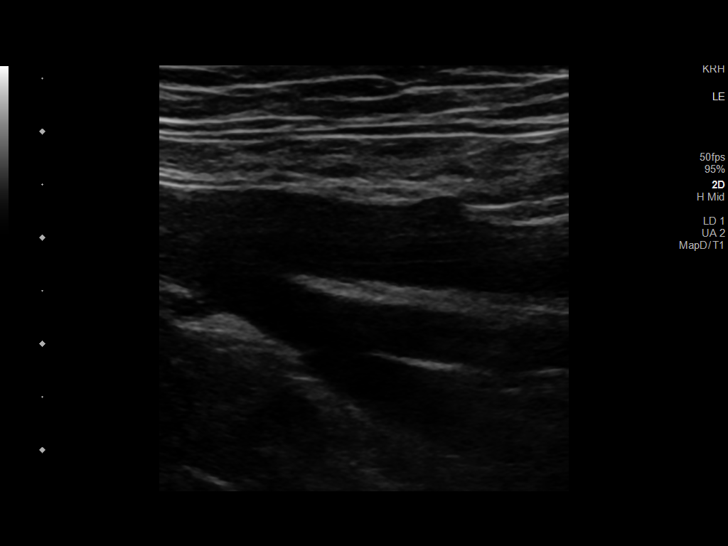
[im 16/41]
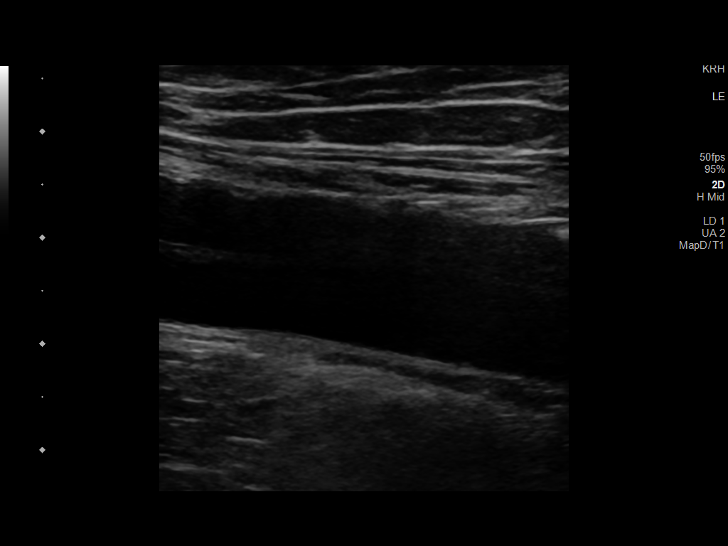
[im 20/41]
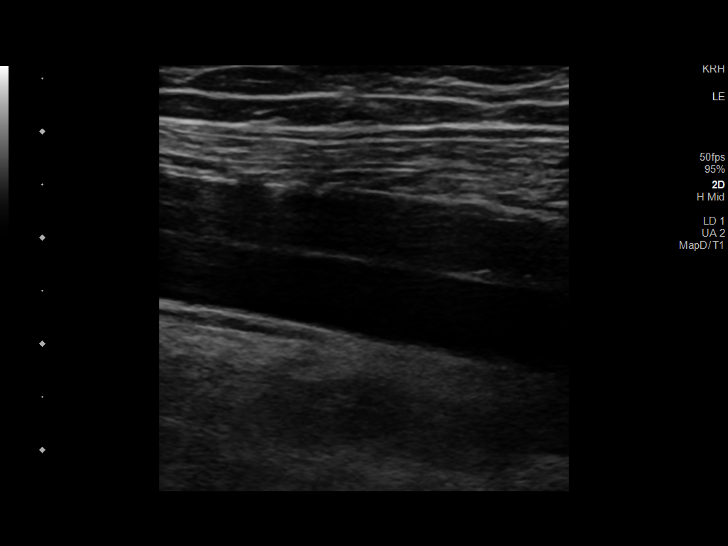
[im 21/41]
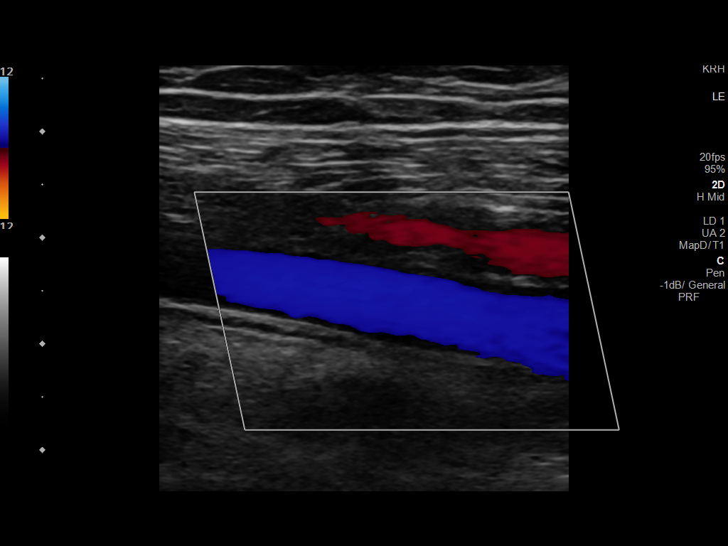
[im 25/41]
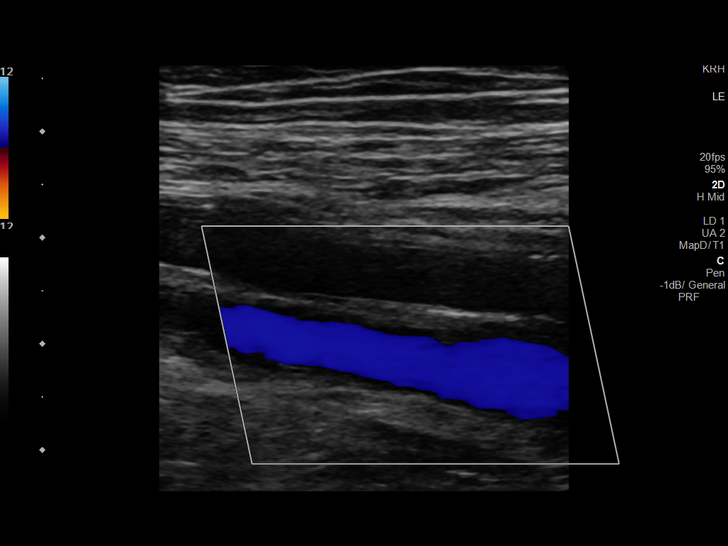
[im 28/41]
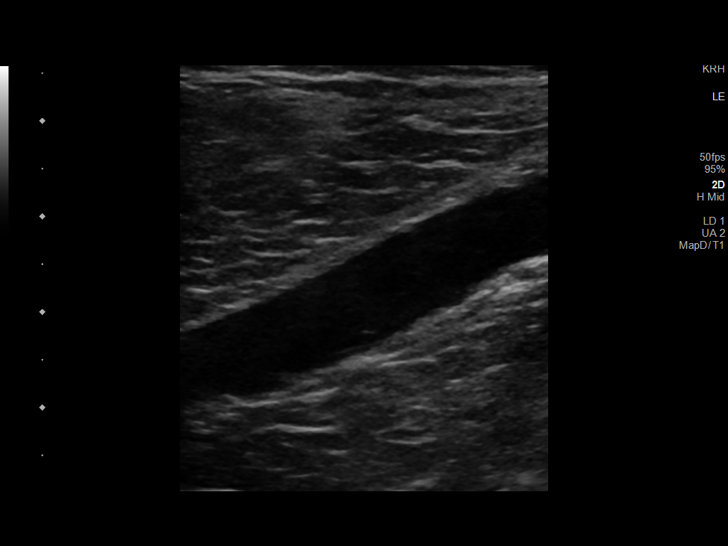
[im 32/41]
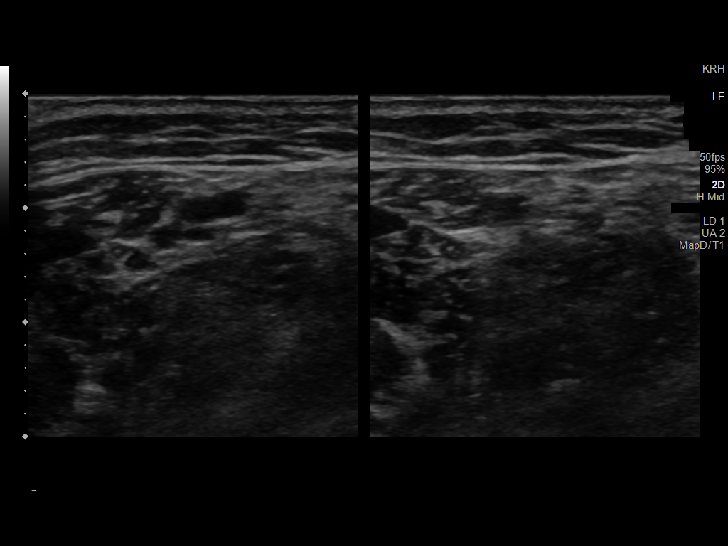
[im 34/41]
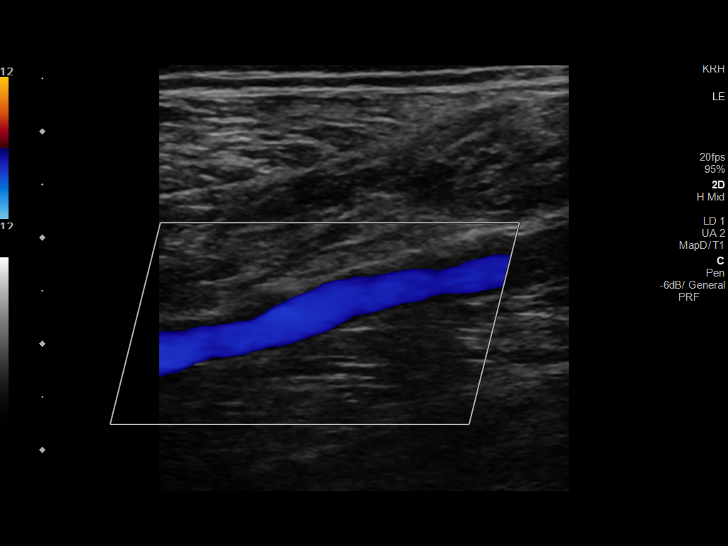
[im 37/41]
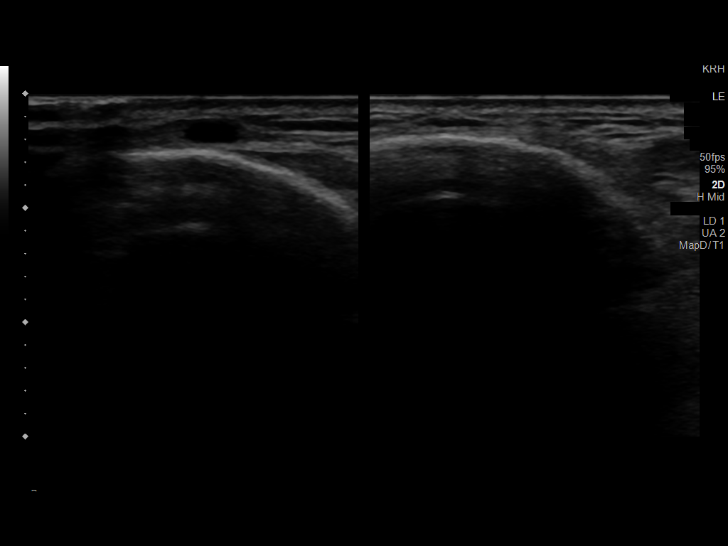
[im 41/41]
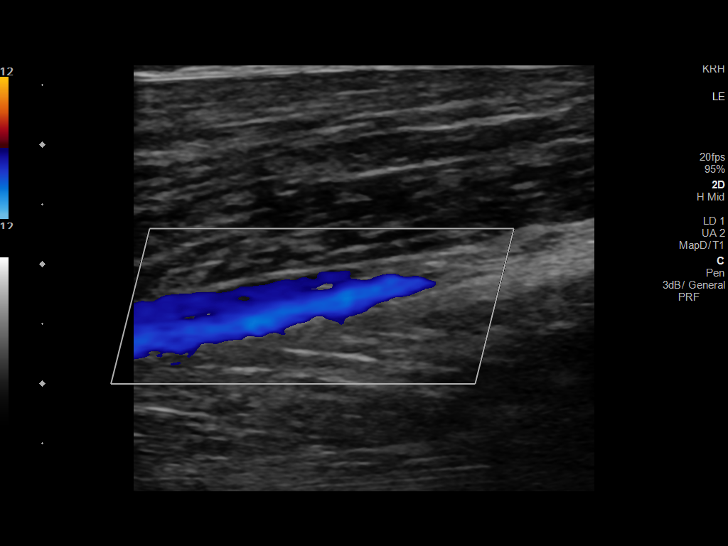

[14 of 24 positions shown; findings below may reference images not displayed]

FINDINGS: VENOUS

Normal compressibility of the common femoral, superficial femoral,
and popliteal veins, as well as the visualized calf veins.
Visualized portions of profunda femoral vein and great saphenous
vein unremarkable. No filling defects to suggest DVT on grayscale or
color Doppler imaging. Doppler waveforms show normal direction of
venous flow, normal respiratory plasticity and response to
augmentation.

Limited views of the contralateral common femoral vein are
unremarkable.

OTHER

None.

Limitations: none
IMPRESSION: No evidence of a right lower extremity deep venous thrombosis.
